# Patient Record
Sex: Female | Born: 2000 | Race: White | Hispanic: No | Marital: Single | Smoking: Never smoker
Health system: Southern US, Community
[De-identification: ages and names within clinical notes are randomized; demographics above are authoritative.]

## PROBLEM LIST (undated history)

## (undated) DIAGNOSIS — Z789 Other specified health status: Secondary | ICD-10-CM

## (undated) HISTORY — DX: Other specified health status: Z78.9

## (undated) HISTORY — PX: WISDOM TOOTH EXTRACTION: SHX21

---
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
## 2020-01-27 IMAGING — DX CHEST - 2 VIEW
2 series · 2 of 2 positions shown · non-contrast
Comparison: None.

CLINICAL DATA: Right posterior chest pain.  No known trauma.

EXAM:
CHEST - 2 VIEW

[chest pa]
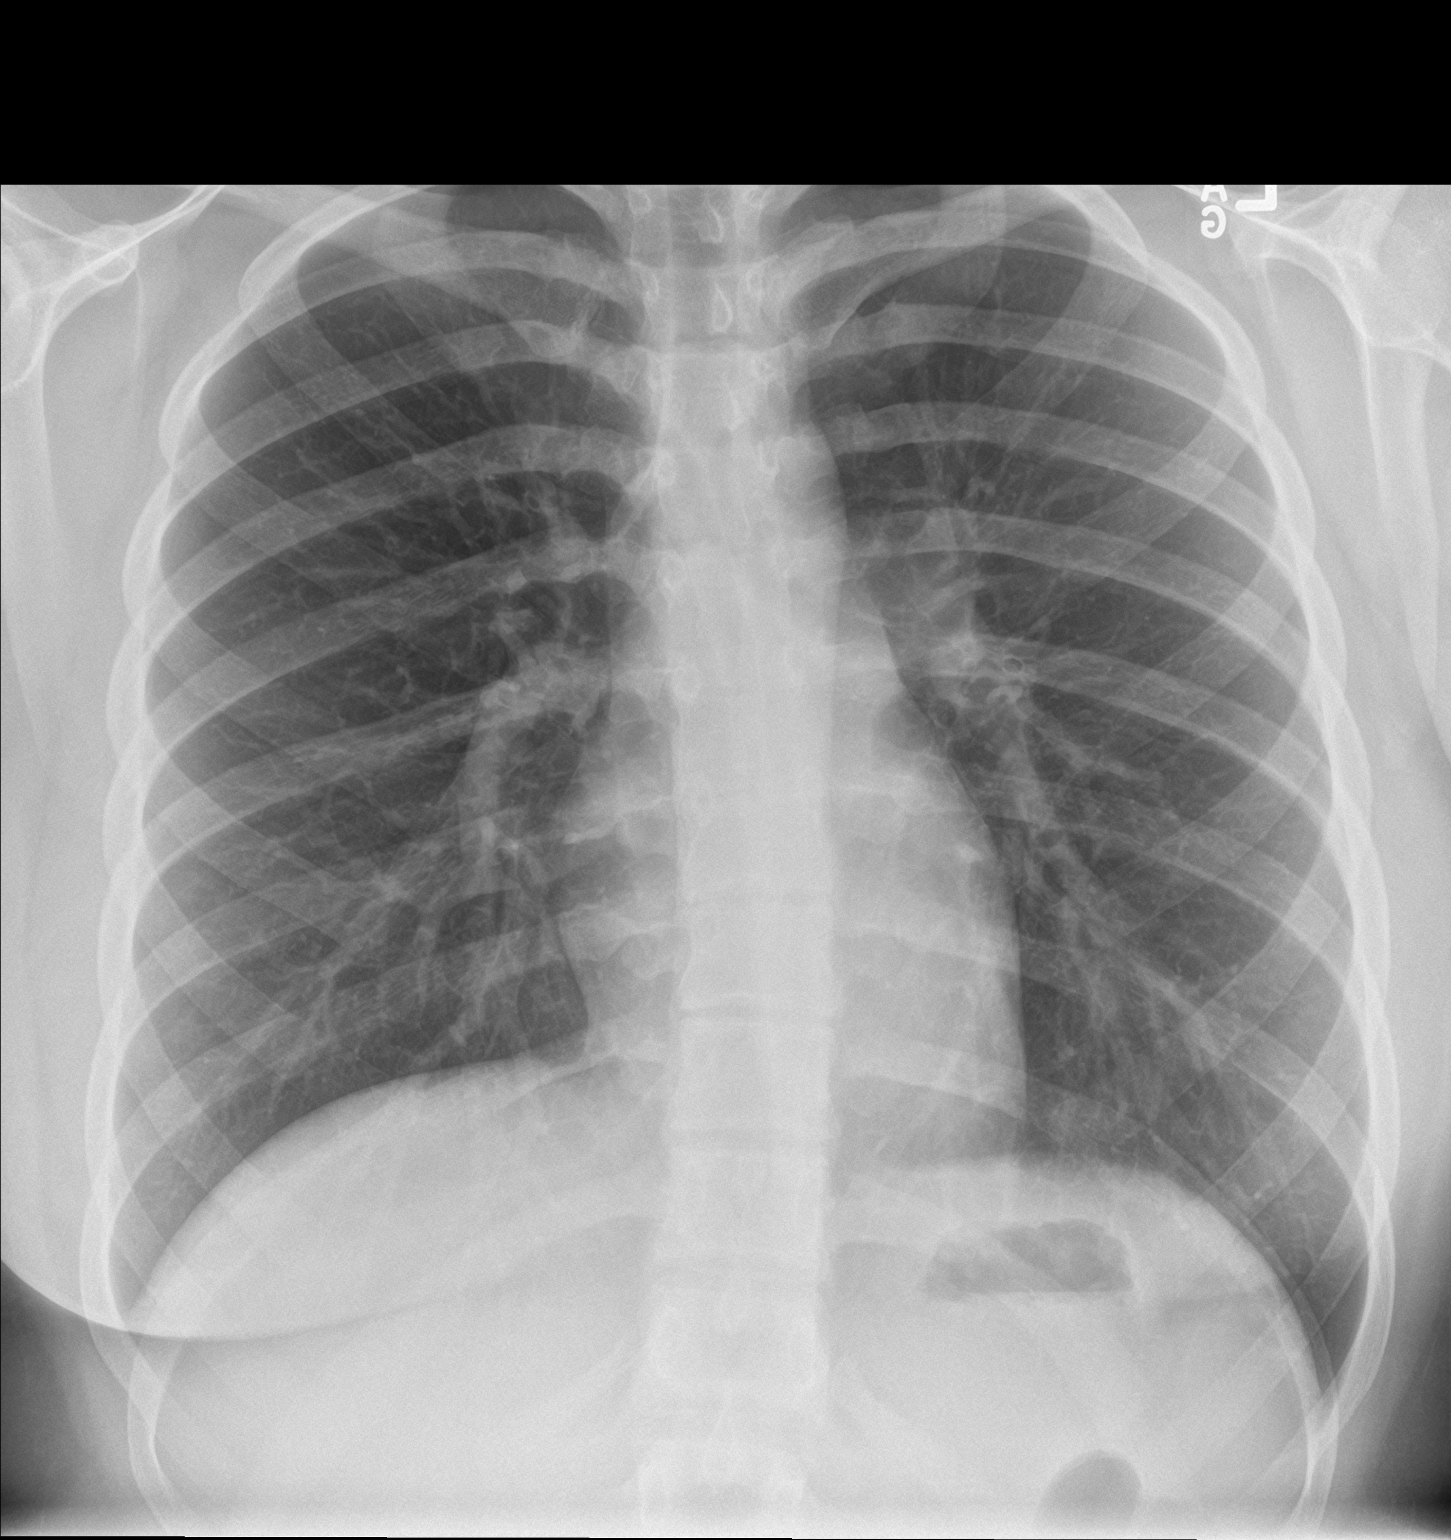

[chest lat]
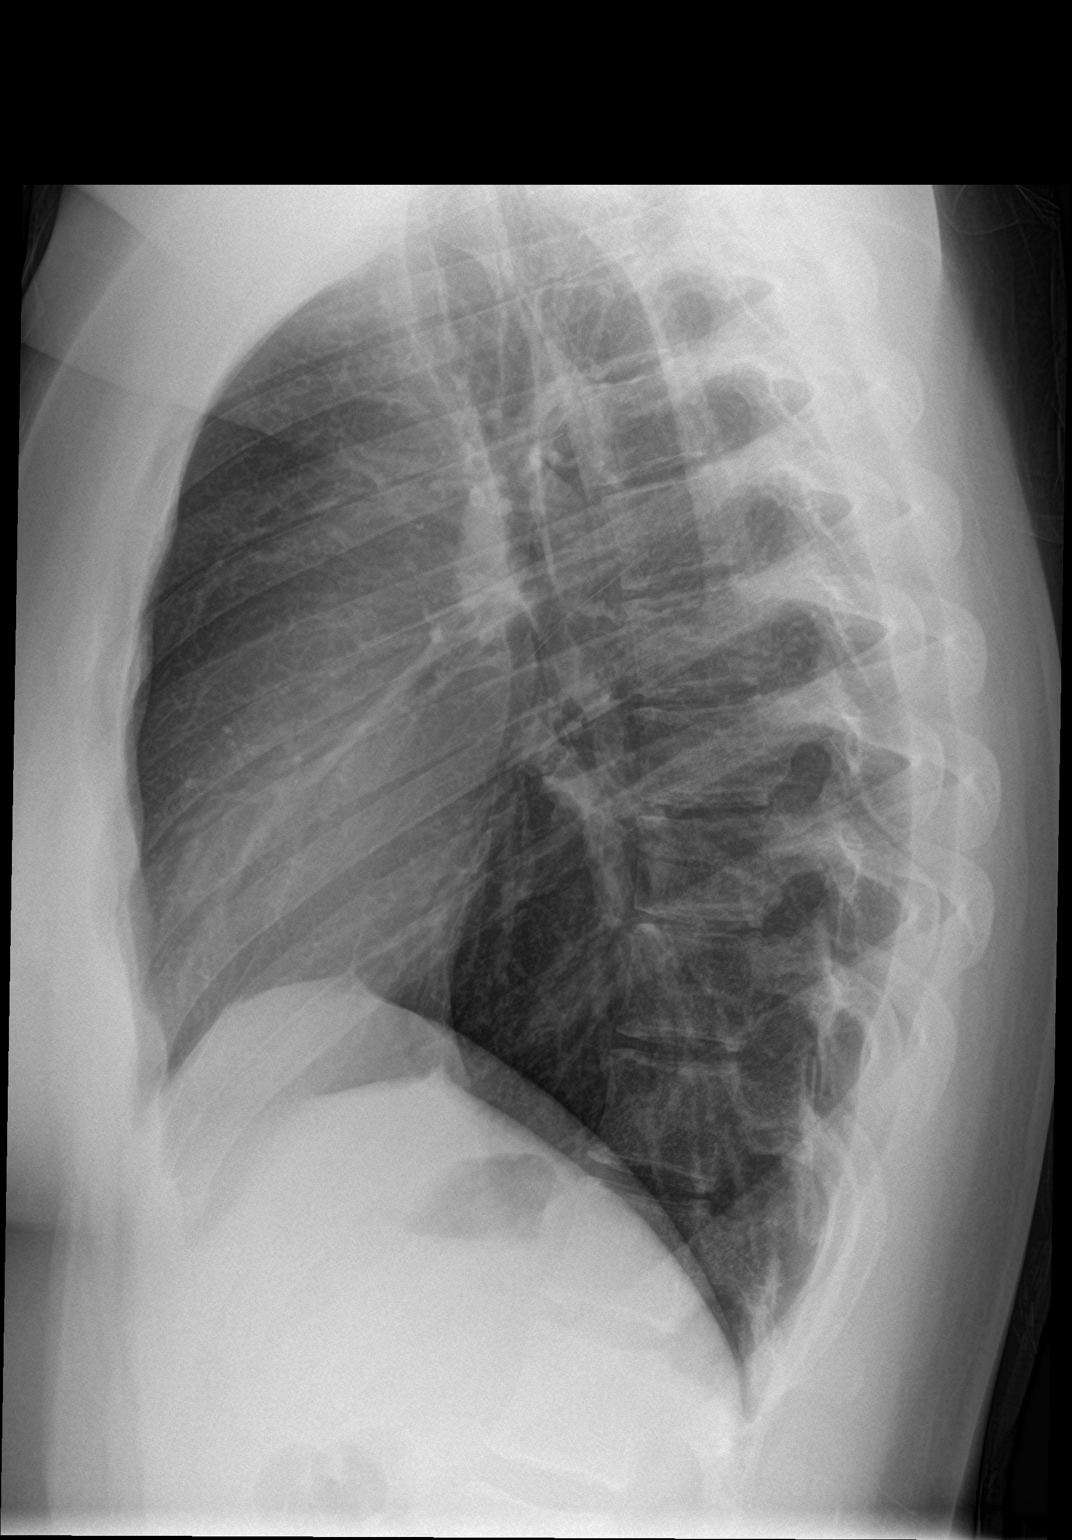

[2 of 2 positions shown; findings below may reference images not displayed]

FINDINGS: Normal cardiac silhouette and mediastinal contours. No focal
parenchymal opacities. No pleural effusion or pneumothorax. No
evidence of edema.

No acute osseous abnormalities with special attention paid to the
right-sided ribs. Mild scoliotic curvature of the thoracolumbar
spine, potentially positional. Regional soft tissues appear normal.
IMPRESSION: No explanation for patient's right posterior chest pain.

## 2021-10-03 ENCOUNTER — Encounter (INDEPENDENT_AMBULATORY_CARE_PROVIDER_SITE_OTHER): Payer: Self-pay | Admitting: *Deleted

## 2021-11-07 ENCOUNTER — Ambulatory Visit (INDEPENDENT_AMBULATORY_CARE_PROVIDER_SITE_OTHER): Payer: Managed Care, Other (non HMO) | Admitting: Gastroenterology

## 2022-07-26 ENCOUNTER — Ambulatory Visit (INDEPENDENT_AMBULATORY_CARE_PROVIDER_SITE_OTHER): Payer: Managed Care, Other (non HMO) | Admitting: Adult Health

## 2022-07-26 ENCOUNTER — Encounter: Payer: Self-pay | Admitting: Adult Health

## 2022-07-26 ENCOUNTER — Other Ambulatory Visit (HOSPITAL_COMMUNITY)
Admission: RE | Admit: 2022-07-26 | Discharge: 2022-07-26 | Disposition: A | Discharge status: Home / Self Care | Payer: Managed Care, Other (non HMO) | Source: Ambulatory Visit | Attending: Adult Health | Admitting: Adult Health

## 2022-07-26 VITALS — BP 119/74 | HR 82 | Ht 66.0 in | Wt 223.0 lb

## 2022-07-26 DIAGNOSIS — O3680X0 Pregnancy with inconclusive fetal viability, not applicable or unspecified: Secondary | ICD-10-CM | POA: Insufficient documentation

## 2022-07-26 DIAGNOSIS — Z01419 Encounter for gynecological examination (general) (routine) without abnormal findings: Secondary | ICD-10-CM | POA: Diagnosis present

## 2022-07-26 DIAGNOSIS — Z3A09 9 weeks gestation of pregnancy: Secondary | ICD-10-CM | POA: Insufficient documentation

## 2022-07-26 DIAGNOSIS — Z3201 Encounter for pregnancy test, result positive: Secondary | ICD-10-CM | POA: Insufficient documentation

## 2022-07-26 LAB — POCT URINE PREGNANCY: Preg Test, Ur: POSITIVE — AB

## 2022-07-26 NOTE — Progress Notes (Signed)
Patient ID: Christina Schultz, female   DOB: Jun 02, 2001, 21 y.o.   MRN: [id number] History of Present Illness: Christina Schultz is a 21 year old white female,single, G1P0, in for well woman gyn exam and first pap. She had +UPT Tuesday and has had nausea for about 2 weeks. LMP about 05/22/22. She works in Location manager at [employer].  PCP is Edison Pace PA.   Current Medications, Allergies, Past Medical History, Past Surgical History, Family History and Social History were reviewed in [employer] record.     Review of Systems: Patient denies any headaches, hearing loss, fatigue, blurred vision, shortness of breath, chest pain, abdominal pain, problems with bowel movements, urination, or intercourse. No joint pain or mood swings.  +missed periods +nausea    Physical Exam:BP 119/74 (BP Location: Left Arm, Patient Position: Sitting, Cuff Size: Normal)   Pulse 82   Ht 5\' 6"  (1.676 m)   Wt 223 lb (101.2 kg)   LMP 05/22/2022   BMI 35.99 kg/m  UPT is +, about 9+2 weeks by LMP with EDD 02/27/23. General:  Well developed, well nourished, no acute distress Skin:  Warm and dry Neck:  Midline trachea, normal thyroid, good ROM, no lymphadenopathy Lungs; Clear to auscultation bilaterally Breast:  No dominant palpable mass, retraction, or nipple discharge,has bilateral nipple rods Cardiovascular: Regular rate and rhythm Abdomen:  Soft, non tender, no hepatosplenomegaly Pelvic:  External genitalia is normal in appearance, no lesions.  The vagina is normal in appearance. Urethra has no lesions or masses. The cervix is nulliparous, pap with CG/CHL performed.  Uterus is felt to be about 8 weeks in size,non tender.  No adnexal masses or tenderness noted.Bladder is non tender, no masses felt. Extremities/musculoskeletal:  No swelling or varicosities noted, no clubbing or cyanosis Psych:  No mood changes, alert and cooperative,seems happy AA is 4,none now Fall risk is low    07/26/2022    2:02 PM   Depression screen PHQ 2/9  Decreased Interest 0  Down, Depressed, Hopeless 0  PHQ - 2 Score 0  Altered sleeping 1  Tired, decreased energy 2  Change in appetite 1  Feeling bad or failure about yourself  0  Trouble concentrating 0  Moving slowly or fidgety/restless 0  Suicidal thoughts 0  PHQ-9 Score 4       07/26/2022    2:02 PM  GAD 7 : Generalized Anxiety Score  Nervous, Anxious, on Edge 0  Control/stop worrying 0  Worry too much - different things 0  Trouble relaxing 0  Restless 0  Easily annoyed or irritable 1  Afraid - awful might happen 0  Total GAD 7 Score 1    Upstream - 07/26/22 1401       Pregnancy Intention Screening   Does the patient want to become pregnant in the next year? N/A    Does the patient's partner want to become pregnant in the next year? N/A    Would the patient like to discuss contraceptive options today? N/A      Contraception Wrap Up   Current Method Pregnant/Seeking Pregnancy    End Method Pregnant/Seeking Pregnancy              Examination chaperoned by 09/25/22 LPN  Impression and Plan: 1. Encounter for gynecological examination with Papanicolaou smear of cervix Pap sent Pap in 3 years if normal Physical in 1 year  - Cytology - PAP( [place])  2. Pregnancy examination or test, positive result - POCT urine  pregnancy  3. [redacted] weeks gestation of pregnancy Take OTC PNV Review OB handout by [facility]   4. Encounter to determine fetal viability of pregnancy, single or unspecified fetus Will get dating [place] in about 3 weeks  - US OB Comp Less 14 Wks; Future

## 2022-08-01 LAB — CYTOLOGY - PAP
Chlamydia: NEGATIVE
Comment: NEGATIVE
Comment: NORMAL
Diagnosis: NEGATIVE
Neisseria Gonorrhea: NEGATIVE

## 2022-08-17 ENCOUNTER — Ambulatory Visit (INDEPENDENT_AMBULATORY_CARE_PROVIDER_SITE_OTHER): Payer: Managed Care, Other (non HMO)

## 2022-08-17 ENCOUNTER — Encounter: Payer: Self-pay | Admitting: Obstetrics & Gynecology

## 2022-08-17 DIAGNOSIS — O3680X0 Pregnancy with inconclusive fetal viability, not applicable or unspecified: Secondary | ICD-10-CM | POA: Diagnosis not present

## 2022-08-17 DIAGNOSIS — Z3A14 14 weeks gestation of pregnancy: Secondary | ICD-10-CM

## 2022-08-17 NOTE — Progress Notes (Signed)
[place] 11+2 wks,single IUP with yolk sac,CRL 45 mm,FHR 167 bpm,normal ovaries

## 2022-08-21 ENCOUNTER — Other Ambulatory Visit: Payer: Managed Care, Other (non HMO)

## 2022-08-29 ENCOUNTER — Ambulatory Visit: Payer: Managed Care, Other (non HMO) | Admitting: *Deleted

## 2022-08-29 ENCOUNTER — Ambulatory Visit: Payer: Managed Care, Other (non HMO) | Admitting: Advanced Practice Midwife

## 2022-08-29 ENCOUNTER — Encounter: Payer: Self-pay | Admitting: Advanced Practice Midwife

## 2022-08-29 VITALS — BP 118/73 | HR 79 | Wt 224.0 lb

## 2022-08-29 DIAGNOSIS — Z3A13 13 weeks gestation of pregnancy: Secondary | ICD-10-CM

## 2022-08-29 DIAGNOSIS — Z363 Encounter for antenatal screening for malformations: Secondary | ICD-10-CM | POA: Diagnosis not present

## 2022-08-29 DIAGNOSIS — Z3401 Encounter for supervision of normal first pregnancy, first trimester: Secondary | ICD-10-CM | POA: Diagnosis not present

## 2022-08-29 DIAGNOSIS — Z6836 Body mass index (BMI) 36.0-36.9, adult: Secondary | ICD-10-CM

## 2022-08-29 DIAGNOSIS — Z3400 Encounter for supervision of normal first pregnancy, unspecified trimester: Secondary | ICD-10-CM | POA: Insufficient documentation

## 2022-08-29 LAB — POCT URINALYSIS DIPSTICK OB
Blood, UA: NEGATIVE
Glucose, UA: NEGATIVE
Ketones, UA: NEGATIVE
Leukocytes, UA: NEGATIVE
Nitrite, UA: NEGATIVE
POC,PROTEIN,UA: NEGATIVE

## 2022-08-29 MED ORDER — ASPIRIN 81 MG PO CHEW: 162.0000 mg | CHEWABLE_TABLET | Freq: Every day | ORAL | 7 refills | Status: DC

## 2022-08-29 NOTE — Progress Notes (Signed)
INITIAL OBSTETRICAL VISIT Patient name: Christina Schultz MRN [id number]  Date of birth: [birth year] Chief Complaint:   Initial Prenatal Visit  History of Present Illness:   Christina Schultz is a 21 y.o. G1P0 Caucasian female at [redacted]w[redacted]d by [place] at 11.2 weeks with an Estimated Date of Delivery: 03/06/23 being seen today for her initial obstetrical visit.   Patient's last menstrual period was 05/09/2022. Her obstetrical history is significant for primigravida.   Today she reports fatigue and nausea.  Last pap Sept 2023. Results were: NILM w/ HRHPV not done     07/26/2022    2:02 PM  Depression screen PHQ 2/9  Decreased Interest 0  Down, Depressed, Hopeless 0  PHQ - 2 Score 0  Altered sleeping 1  Tired, decreased energy 2  Change in appetite 1  Feeling bad or failure about yourself  0  Trouble concentrating 0  Moving slowly or fidgety/restless 0  Suicidal thoughts 0  PHQ-9 Score 4        07/26/2022    2:02 PM  GAD 7 : Generalized Anxiety Score  Nervous, Anxious, on Edge 0  Control/stop worrying 0  Worry too much - different things 0  Trouble relaxing 0  Restless 0  Easily annoyed or irritable 1  Afraid - awful might happen 0  Total GAD 7 Score 1     Review of Systems:   Pertinent items are noted in HPI Denies cramping/contractions, leakage of fluid, vaginal bleeding, abnormal vaginal discharge w/ itching/odor/irritation, headaches, visual changes, shortness of breath, chest pain, abdominal pain, severe nausea/vomiting, or problems with urination or bowel movements unless otherwise stated above.  Pertinent History Reviewed:  Reviewed past medical,surgical, social, obstetrical and family history.  Reviewed problem list, medications and allergies. OB History  Gravida Para Term Preterm AB Living  1            SAB IAB Ectopic Multiple Live Births               # Outcome Date GA Lbr Len/2nd Weight Sex Delivery Anes PTL Lv  1 Current            Physical Assessment:   Vitals:    08/29/22 1008  BP: 118/73  Pulse: 79  Weight: 224 lb (101.6 kg)  Body mass index is 36.15 kg/m.       Physical Examination:  General appearance - well appearing, and in no distress  Mental status - alert, oriented to person, place, and time  Psych:  She has a normal mood and affect  Skin - warm and dry, normal color, no suspicious lesions noted  Chest - effort normal, all lung fields clear to auscultation bilaterally  Heart - normal rate and regular rhythm  Abdomen - soft, nontender; FHTs 160 bpm  Extremities:  No swelling or varicosities noted  Pelvic - not indicated  Thin prep pap is not done   TODAY'S NT : declines  Results for orders placed or performed in visit on 08/29/22 (from the past 24 hour(s))  POC Urinalysis Dipstick OB   Collection Time: 08/29/22 10:56 AM  Result Value Ref Range   Color, UA     Clarity, UA     Glucose, UA Negative Negative   Bilirubin, UA     Ketones, UA neg    Spec Grav, UA     Blood, UA neg    pH, UA     POC,PROTEIN,UA Negative Negative, Trace, Small (1+), Moderate (2+), Large (3+), 4+  Urobilinogen, UA     Nitrite, UA neg    Leukocytes, UA Negative Negative   Appearance     Odor      Assessment & Plan:  1) Low-Risk Pregnancy G1P0 at [redacted]w[redacted]d with an Estimated Date of Delivery: 03/06/23   2) Initial OB visit  3) BMI/nullip, rx bASA 162mg /day  Meds:  Meds ordered this encounter  Medications   aspirin 81 MG chewable tablet    Sig: Chew 2 tablets (162 mg total) by mouth daily.    Dispense:  60 tablet    Refill:  7    Order Specific Question:   Supervising Provider    Answer:   Janyth Pupa [[id number]]    Initial labs obtained Continue prenatal vitamins Reviewed n/v relief measures and warning s/s to report Reviewed recommended weight gain based on pre-gravid BMI Encouraged well-balanced diet Genetic & carrier screening discussed: requests Panorama, AFP, and Horizon , declines NT/IT Ultrasound discussed; fetal survey:  requested CCNC completed> form faxed if has or is planning to apply for medicaid The nature of [place] for [employer] with multiple MDs and other Advanced Practice Providers was explained to patient; also emphasized that fellows, residents, and students are part of our team. Does not have home bp cuff. Office bp cuff given: yes. Rx sent: n/a. Check bp weekly, let us know if consistently >140/90.   Indications for ASA therapy (per uptodate) OR Two or more of the following: Nulliparity Yes Obesity (BMI>30 kg/m2) Yes  Indications for early A1C (per uptodate) First-degree relative with diabetes Yes (pt's mother)  Follow-up: Return for 4wk LROB, then 7wk LROB and anatomy u/s.   Orders Placed This Encounter  Procedures   Urine Culture   GC/Chlamydia Probe Amp   US OB Comp + 14 Wk   CBC/D/Plt+RPR+Rh+ABO+RubIgG...   HORIZON CUSTOM   PANORAMA PRENATAL TEST FULL PANEL   Hemoglobin A1c   POC Urinalysis Dipstick OB    Myrtis Ser [facility] 08/29/2022 11:24 AM

## 2022-08-29 NOTE — Patient Instructions (Signed)
Christina Schultz, thank you for choosing our office today! We appreciate the opportunity to meet your healthcare needs. You may receive a short survey by mail, e-mail, or through [employer]. If you are happy with your care we would appreciate if you could take just a few minutes to complete the survey questions. We read all of your comments and take your feedback very seriously. Thank you again for choosing our office.  [facility] at [place] at [facility] ([place], [place] [address]) Entrance C, located off of [address]   Nausea & Vomiting Have saltine crackers or pretzels by your bed and eat a few bites before you raise your head out of bed in the morning Eat small frequent meals throughout the day instead of large meals Drink plenty of fluids throughout the day to stay hydrated, just don't drink a lot of fluids with your meals.  This can make your stomach fill up faster making you feel sick Do not brush your teeth right after you eat Products with real ginger are good for nausea, like ginger ale and ginger hard candy Make sure it says made with real ginger! Sucking on sour candy like lemon heads is also good for nausea If your prenatal vitamins make you nauseated, take them at night so you will sleep through the nausea Sea Bands If you feel like you need medicine for the nausea & vomiting please let us know If you are unable to keep any fluids or food down please let us know   Constipation Drink plenty of fluid, preferably water, throughout the day Eat foods high in fiber such as fruits, vegetables, and grains Exercise, such as walking, is a good way to keep your bowels regular Drink warm fluids, especially warm prune juice, or decaf coffee Eat a 1/2 cup of real oatmeal (not instant), 1/2 cup applesauce, and 1/2-1 cup warm prune juice every day If needed, you may take Colace (docusate sodium) stool softener  once or twice a day to help keep the stool soft.  If you still are having problems with constipation, you may take Miralax once daily as needed to help keep your bowels regular.   Home Blood Pressure Monitoring for Patients   Your provider has recommended that you check your blood pressure (BP) at least once a week at home. If you do not have a blood pressure cuff at home, one will be provided for you. Contact your provider if you have not received your monitor within 1 week.   Helpful Tips for Accurate Home Blood Pressure Checks  Don't smoke, exercise, or drink caffeine 30 minutes before checking your BP Use the restroom before checking your BP (a full bladder can raise your pressure) Relax in a comfortable upright chair Feet on the ground Left arm resting comfortably on a flat surface at the level of your heart Legs uncrossed Back supported Sit quietly and don't talk Place the cuff on your bare arm Adjust snuggly, so that only two fingertips can fit between your skin and the top of the cuff Check 2 readings separated by at least one minute Keep a log of your BP readings For a visual, please reference this diagram: http://ccnc.care/bpdiagram  Provider Name: [facility]     Phone: [phone]  Zone 1: ALL CLEAR  Continue to monitor your symptoms:  BP reading is less than 140 (top number) or less than 90 (bottom  number)  No right upper stomach pain No headaches or seeing spots No feeling nauseated or throwing up No swelling in face and hands  Zone 2: CAUTION Call your doctor's office for any of the following:  BP reading is greater than 140 (top number) or greater than 90 (bottom number)  Stomach pain under your ribs in the middle or right side Headaches or seeing spots Feeling nauseated or throwing up Swelling in face and hands  Zone 3: EMERGENCY  Seek immediate medical care if you have any of the following:  BP reading is greater than160 (top number) or greater than  110 (bottom number) Severe headaches not improving with Tylenol Serious difficulty catching your breath Any worsening symptoms from Zone 2    First Trimester of Pregnancy The first trimester of pregnancy is from week 1 until the end of week 12 (months 1 through 3). A week after a sperm fertilizes an egg, the egg will implant on the wall of the uterus. This embryo will begin to develop into a baby. Genes from you and your partner are forming the baby. The female genes determine whether the baby is a boy or a girl. At 6-8 weeks, the eyes and face are formed, and the heartbeat can be seen on ultrasound. At the end of 12 weeks, all the baby's organs are formed.  Now that you are pregnant, you will want to do everything you can to have a healthy baby. Two of the most important things are to get good prenatal care and to follow your health care provider's instructions. Prenatal care is all the medical care you receive before the baby's birth. This care will help prevent, find, and treat any problems during the pregnancy and childbirth. BODY CHANGES Your body goes through many changes during pregnancy. The changes vary from woman to woman.  You may gain or lose a couple of pounds at first. You may feel sick to your stomach (nauseous) and throw up (vomit). If the vomiting is uncontrollable, call your health care provider. You may tire easily. You may develop headaches that can be relieved by medicines approved by your health care provider. You may urinate more often. Painful urination may mean you have a bladder infection. You may develop heartburn as a result of your pregnancy. You may develop constipation because certain hormones are causing the muscles that push waste through your intestines to slow down. You may develop hemorrhoids or swollen, bulging veins (varicose veins). Your breasts may begin to grow larger and become tender. Your nipples may stick out more, and the tissue that surrounds them  (areola) may become darker. Your gums may bleed and may be sensitive to brushing and flossing. Dark spots or blotches (chloasma, mask of pregnancy) may develop on your face. This will likely fade after the baby is born. Your menstrual periods will stop. You may have a loss of appetite. You may develop cravings for certain kinds of food. You may have changes in your emotions from day to day, such as being excited to be pregnant or being concerned that something may go wrong with the pregnancy and baby. You may have more vivid and strange dreams. You may have changes in your hair. These can include thickening of your hair, rapid growth, and changes in texture. Some women also have hair loss during or after pregnancy, or hair that feels dry or thin. Your hair will most likely return to normal after your baby is born. WHAT TO EXPECT AT YOUR PRENATAL  VISITS During a routine prenatal visit: You will be weighed to make sure you and the baby are growing normally. Your blood pressure will be taken. Your abdomen will be measured to track your baby's growth. The fetal heartbeat will be listened to starting around week 10 or 12 of your pregnancy. Test results from any previous visits will be discussed. Your health care provider may ask you: How you are feeling. If you are feeling the baby move. If you have had any abnormal symptoms, such as leaking fluid, bleeding, severe headaches, or abdominal cramping. If you have any questions. Other tests that may be performed during your first trimester include: Blood tests to find your blood type and to check for the presence of any previous infections. They will also be used to check for low iron levels (anemia) and Rh antibodies. Later in the pregnancy, blood tests for diabetes will be done along with other tests if problems develop. Urine tests to check for infections, diabetes, or protein in the urine. An ultrasound to confirm the proper growth and development  of the baby. An amniocentesis to check for possible genetic problems. Fetal screens for spina bifida and Down syndrome. You may need other tests to make sure you and the baby are doing well. HOME CARE INSTRUCTIONS  Medicines Follow your health care provider's instructions regarding medicine use. Specific medicines may be either safe or unsafe to take during pregnancy. Take your prenatal vitamins as directed. If you develop constipation, try taking a stool softener if your health care provider approves. Diet Eat regular, well-balanced meals. Choose a variety of foods, such as meat or vegetable-based protein, fish, milk and low-fat dairy products, vegetables, fruits, and whole grain breads and cereals. Your health care provider will help you determine the amount of weight gain that is right for you. Avoid raw meat and uncooked cheese. These carry germs that can cause birth defects in the baby. Eating four or five small meals rather than three large meals a day may help relieve nausea and vomiting. If you start to feel nauseous, eating a few soda crackers can be helpful. Drinking liquids between meals instead of during meals also seems to help nausea and vomiting. If you develop constipation, eat more high-fiber foods, such as fresh vegetables or fruit and whole grains. Drink enough fluids to keep your urine clear or pale yellow. Activity and Exercise Exercise only as directed by your health care provider. Exercising will help you: Control your weight. Stay in shape. Be prepared for labor and delivery. Experiencing pain or cramping in the lower abdomen or low back is a good sign that you should stop exercising. Check with your health care provider before continuing normal exercises. Try to avoid standing for long periods of time. Move your legs often if you must stand in one place for a long time. Avoid heavy lifting. Wear low-heeled shoes, and practice good posture. You may continue to have sex  unless your health care provider directs you otherwise. Relief of Pain or Discomfort Wear a good support bra for breast tenderness.   Take warm sitz baths to soothe any pain or discomfort caused by hemorrhoids. Use hemorrhoid cream if your health care provider approves.   Rest with your legs elevated if you have leg cramps or low back pain. If you develop varicose veins in your legs, wear support hose. Elevate your feet for 15 minutes, 3-4 times a day. Limit salt in your diet. Prenatal Care Schedule your prenatal visits by the  twelfth week of pregnancy. They are usually scheduled monthly at first, then more often in the last 2 months before delivery. Write down your questions. Take them to your prenatal visits. Keep all your prenatal visits as directed by your health care provider. Safety Wear your seat belt at all times when driving. Make a list of emergency phone numbers, including numbers for family, friends, the hospital, and police and fire departments. General Tips Ask your health care provider for a referral to a local prenatal education class. Begin classes no later than at the beginning of month 6 of your pregnancy. Ask for help if you have counseling or nutritional needs during pregnancy. Your health care provider can offer advice or refer you to specialists for help with various needs. Do not use hot tubs, steam rooms, or saunas. Do not douche or use tampons or scented sanitary pads. Do not cross your legs for long periods of time. Avoid cat litter boxes and soil used by cats. These carry germs that can cause birth defects in the baby and possibly loss of the fetus by miscarriage or stillbirth. Avoid all smoking, herbs, alcohol, and medicines not prescribed by your health care provider. Chemicals in these affect the formation and growth of the baby. Schedule a dentist appointment. At home, brush your teeth with a soft toothbrush and be gentle when you floss. SEEK MEDICAL CARE IF:   You have dizziness. You have mild pelvic cramps, pelvic pressure, or nagging pain in the abdominal area. You have persistent nausea, vomiting, or diarrhea. You have a bad smelling vaginal discharge. You have pain with urination. You notice increased swelling in your face, hands, legs, or ankles. SEEK IMMEDIATE MEDICAL CARE IF:  You have a fever. You are leaking fluid from your vagina. You have spotting or bleeding from your vagina. You have severe abdominal cramping or pain. You have rapid weight gain or loss. You vomit blood or material that looks like coffee grounds. You are exposed to [place] measles and have never had them. You are exposed to fifth disease or chickenpox. You develop a severe headache. You have shortness of breath. You have any kind of trauma, such as from a fall or a car accident. Document Released: 10/30/2001 Document Revised: 03/22/2014 Document Reviewed: 09/15/2013 [facility] Patient Information 2015 [place], [place]. This information is not intended to replace advice given to you by your health care provider. Make sure you discuss any questions you have with your health care provider.

## 2022-08-30 LAB — CBC/D/PLT+RPR+RH+ABO+RUBIGG...
Antibody Screen: NEGATIVE
Basophils Absolute: 0 10*3/uL (ref 0.0–0.2)
Basos: 0 %
EOS (ABSOLUTE): 0.1 10*3/uL (ref 0.0–0.4)
Eos: 1 %
HCV Ab: NONREACTIVE
HIV Screen 4th Generation wRfx: NONREACTIVE
Hematocrit: 39.8 % (ref 34.0–46.6)
Hemoglobin: 13.2 g/dL (ref 11.1–15.9)
Hepatitis B Surface Ag: NEGATIVE
Immature Grans (Abs): 0 10*3/uL (ref 0.0–0.1)
Immature Granulocytes: 0 %
Lymphocytes Absolute: 2.1 10*3/uL (ref 0.7–3.1)
Lymphs: 22 %
MCH: 29.3 pg (ref 26.6–33.0)
MCHC: 33.2 g/dL (ref 31.5–35.7)
MCV: 88 fL (ref 79–97)
Monocytes Absolute: 0.5 10*3/uL (ref 0.1–0.9)
Monocytes: 6 %
Neutrophils Absolute: 6.8 10*3/uL (ref 1.4–7.0)
Neutrophils: 71 %
Platelets: 279 10*3/uL (ref 150–450)
RBC: 4.5 x10E6/uL (ref 3.77–5.28)
RDW: 13 % (ref 11.7–15.4)
RPR Ser Ql: NONREACTIVE
Rh Factor: POSITIVE
Rubella Antibodies, IGG: 5.82 index (ref >0.99)
WBC: 9.5 10*3/uL (ref 3.4–10.8)

## 2022-08-30 LAB — HCV INTERPRETATION

## 2022-08-30 LAB — HEMOGLOBIN A1C
Est. average glucose Bld gHb Est-mCnc: 103 mg/dL
Hgb A1c MFr Bld: 5.2 % (ref 4.8–5.6)

## 2022-08-31 LAB — GC/CHLAMYDIA PROBE AMP
Chlamydia trachomatis, NAA: NEGATIVE
Neisseria Gonorrhoeae by PCR: NEGATIVE

## 2022-08-31 LAB — URINE CULTURE

## 2022-09-04 LAB — PANORAMA PRENATAL TEST FULL PANEL:PANORAMA TEST PLUS 5 ADDITIONAL MICRODELETIONS: FETAL FRACTION: 3.9

## 2022-09-09 LAB — HORIZON CUSTOM: REPORT SUMMARY: NEGATIVE

## 2022-09-26 ENCOUNTER — Ambulatory Visit (INDEPENDENT_AMBULATORY_CARE_PROVIDER_SITE_OTHER): Payer: Managed Care, Other (non HMO) | Admitting: Advanced Practice Midwife

## 2022-09-26 VITALS — BP 135/81 | HR 102 | Wt 224.0 lb

## 2022-09-26 DIAGNOSIS — Z3A17 17 weeks gestation of pregnancy: Secondary | ICD-10-CM

## 2022-09-26 DIAGNOSIS — Z1379 Encounter for other screening for genetic and chromosomal anomalies: Secondary | ICD-10-CM

## 2022-09-26 DIAGNOSIS — Z3402 Encounter for supervision of normal first pregnancy, second trimester: Secondary | ICD-10-CM

## 2022-09-26 NOTE — Patient Instructions (Signed)
Christina Schultz, thank you for choosing our office today! We appreciate the opportunity to meet your healthcare needs. You may receive a short survey by mail, e-mail, or through MyChart. If you are happy with your care we would appreciate if you could take just a few minutes to complete the survey questions. We read all of your comments and take your feedback very seriously. Thank you again for choosing our office.  [facility] Team at [facility] ([address] [place], [place] [address]) Entrance C, located off of [address] Free 24/7 valet parking  Go to [email or web address] to register for FREE online childbirth classes  Call the office ([phone]) or go to [facility] if: You begin to severe cramping Your water breaks.  Sometimes it is a big gush of fluid, sometimes it is just a trickle that keeps getting your panties wet or running down your legs You have vaginal bleeding.  It is normal to have a small amount of spotting if your cervix was checked.   [place] [facility]/Family Doctors [facility] ([facility]): [address], [phone]           [facility]: [address], [phone]                [facility] ([facility]): [address], [phone] (call to ask if accepting patients) [facility]: [address], [place], [phone]    [place] [facility]/Family Doctors [facility] ([facility]): [address], [phone] [facility]: [address], [phone] [facility]: [address], [phone]  [place] [facility]  [facility] ([facility]): [phone] [facility]: [address], [phone]   [place] [facility]: [address], [phone]  [place] [facility]  [facility]: [address], [phone]  Home Blood Pressure Monitoring for Patients   Your provider has recommended that you check your blood pressure (BP) at least once a week at home. If you do not have a blood pressure cuff at home, one will be provided for you. Contact your provider if you have not received your monitor within 1 week.   Helpful Tips for Accurate Home Blood Pressure Checks  Don't smoke, exercise, or drink caffeine 30 minutes before checking your BP Use the restroom before checking your BP (a full bladder can raise your pressure) Relax in a comfortable upright chair Feet on the ground Left arm resting comfortably on a flat surface at the level of your heart Legs uncrossed Back supported Sit quietly and don't talk Place the cuff on your bare arm Adjust snuggly, so that only two fingertips can fit between your skin and the top of the cuff Check 2 readings separated by at least one minute Keep a log of your BP readings For a visual, please reference this diagram: http://[facility].care/bpdiagram  Provider Name: [facility]     Phone: [phone]  Zone 1: ALL CLEAR  Continue to monitor your symptoms:  BP reading is less than 140 (top number) or less than 90 (bottom number)  No right upper stomach pain No headaches or seeing spots No feeling nauseated or throwing up No swelling in face and hands  Zone 2: CAUTION Call your doctor's office for any of the following:  BP reading is greater than 140 (top number) or greater than   90 (bottom number)  Stomach pain under your ribs in the middle or right side Headaches or seeing spots Feeling nauseated or throwing up Swelling in face and hands  Zone 3: EMERGENCY  Seek immediate medical care if you have any of the following:  BP reading is greater than160 (top number) or greater than 110 (bottom number) Severe headaches not improving with Tylenol Serious difficulty catching your breath Any worsening symptoms from  Zone 2     Second Trimester of Pregnancy The second trimester is from week 14 through week 27 (months 4 through 6). The second trimester is often a time when you feel your best. Your body has adjusted to being pregnant, and you begin to feel better physically. Usually, morning sickness has lessened or quit completely, you may have more energy, and you may have an increase in appetite. The second trimester is also a time when the fetus is growing rapidly. At the end of the sixth month, the fetus is about 9 inches long and weighs about 1 pounds. You will likely begin to feel the baby move (quickening) between 16 and 20 weeks of pregnancy. Body changes during your second trimester Your body continues to go through many changes during your second trimester. The changes vary from woman to woman. Your weight will continue to increase. You will notice your lower abdomen bulging out. You may begin to get stretch marks on your hips, abdomen, and breasts. You may develop headaches that can be relieved by medicines. The medicines should be approved by your health care provider. You may urinate more often because the fetus is pressing on your bladder. You may develop or continue to have heartburn as a result of your pregnancy. You may develop constipation because certain hormones are causing the muscles that push waste through your intestines to slow down. You may develop hemorrhoids or swollen, bulging veins (varicose veins). You may have back pain. This is caused by: Weight gain. Pregnancy hormones that are relaxing the joints in your pelvis. A shift in weight and the muscles that support your balance. Your breasts will continue to grow and they will continue to become tender. Your gums may bleed and may be sensitive to brushing and flossing. Dark spots or blotches (chloasma, mask of pregnancy) may develop on your face. This will likely fade after the baby is born. A dark line from your belly button to  the pubic area (linea nigra) may appear. This will likely fade after the baby is born. You may have changes in your hair. These can include thickening of your hair, rapid growth, and changes in texture. Some women also have hair loss during or after pregnancy, or hair that feels dry or thin. Your hair will most likely return to normal after your baby is born.  What to expect at prenatal visits During a routine prenatal visit: You will be weighed to make sure you and the fetus are growing normally. Your blood pressure will be taken. Your abdomen will be measured to track your baby's growth. The fetal heartbeat will be listened to. Any test results from the previous visit will be discussed.  Your health care provider may ask you: How you are feeling. If you are feeling the baby move. If you have had any abnormal symptoms, such as leaking fluid, bleeding, severe headaches, or abdominal cramping. If you are using any tobacco products, including cigarettes, chewing tobacco, and electronic cigarettes. If you have any questions.  Other tests that may be performed during   your second trimester include: Blood tests that check for: Low iron levels (anemia). High blood sugar that affects pregnant women (gestational diabetes) between 24 and 28 weeks. Rh antibodies. This is to check for a protein on red blood cells (Rh factor). Urine tests to check for infections, diabetes, or protein in the urine. An ultrasound to confirm the proper growth and development of the baby. An amniocentesis to check for possible genetic problems. Fetal screens for spina bifida and Down syndrome. HIV (human immunodeficiency virus) testing. Routine prenatal testing includes screening for HIV, unless you choose not to have this test.  Follow these instructions at home: Medicines Follow your health care provider's instructions regarding medicine use. Specific medicines may be either safe or unsafe to take during  pregnancy. Take a prenatal vitamin that contains at least 600 micrograms (mcg) of folic acid. If you develop constipation, try taking a stool softener if your health care provider approves. Eating and drinking Eat a balanced diet that includes fresh fruits and vegetables, whole grains, good sources of protein such as meat, eggs, or tofu, and low-fat dairy. Your health care provider will help you determine the amount of weight gain that is right for you. Avoid raw meat and uncooked cheese. These carry germs that can cause birth defects in the baby. If you have low calcium intake from food, talk to your health care provider about whether you should take a daily calcium supplement. Limit foods that are high in fat and processed sugars, such as fried and sweet foods. To prevent constipation: Drink enough fluid to keep your urine clear or pale yellow. Eat foods that are high in fiber, such as fresh fruits and vegetables, whole grains, and beans. Activity Exercise only as directed by your health care provider. Most women can continue their usual exercise routine during pregnancy. Try to exercise for 30 minutes at least 5 days a week. Stop exercising if you experience uterine contractions. Avoid heavy lifting, wear low heel shoes, and practice good posture. A sexual relationship may be continued unless your health care provider directs you otherwise. Relieving pain and discomfort Wear a good support bra to prevent discomfort from breast tenderness. Take warm sitz baths to soothe any pain or discomfort caused by hemorrhoids. Use hemorrhoid cream if your health care provider approves. Rest with your legs elevated if you have leg cramps or low back pain. If you develop varicose veins, wear support hose. Elevate your feet for 15 minutes, 3-4 times a day. Limit salt in your diet. Prenatal Care Write down your questions. Take them to your prenatal visits. Keep all your prenatal visits as told by your health  care provider. This is important. Safety Wear your seat belt at all times when driving. Make a list of emergency phone numbers, including numbers for family, friends, the hospital, and police and fire departments. General instructions Ask your health care provider for a referral to a local prenatal education class. Begin classes no later than the beginning of month 6 of your pregnancy. Ask for help if you have counseling or nutritional needs during pregnancy. Your health care provider can offer advice or refer you to specialists for help with various needs. Do not use hot tubs, steam rooms, or saunas. Do not douche or use tampons or scented sanitary pads. Do not cross your legs for long periods of time. Avoid cat litter boxes and soil used by cats. These carry germs that can cause birth defects in the baby and possibly loss of the   fetus by miscarriage or stillbirth. Avoid all smoking, herbs, alcohol, and unprescribed drugs. Chemicals in these products can affect the formation and growth of the baby. Do not use any products that contain nicotine or tobacco, such as cigarettes and e-cigarettes. If you need help quitting, ask your health care provider. Visit your dentist if you have not gone yet during your pregnancy. Use a soft toothbrush to brush your teeth and be gentle when you floss. Contact a health care provider if: You have dizziness. You have mild pelvic cramps, pelvic pressure, or nagging pain in the abdominal area. You have persistent nausea, vomiting, or diarrhea. You have a bad smelling vaginal discharge. You have pain when you urinate. Get help right away if: You have a fever. You are leaking fluid from your vagina. You have spotting or bleeding from your vagina. You have severe abdominal cramping or pain. You have rapid weight gain or weight loss. You have shortness of breath with chest pain. You notice sudden or extreme swelling of your face, hands, ankles, feet, or legs. You  have not felt your baby move in over an hour. You have severe headaches that do not go away when you take medicine. You have vision changes. Summary The second trimester is from week 14 through week 27 (months 4 through 6). It is also a time when the fetus is growing rapidly. Your body goes through many changes during pregnancy. The changes vary from woman to woman. Avoid all smoking, herbs, alcohol, and unprescribed drugs. These chemicals affect the formation and growth your baby. Do not use any tobacco products, such as cigarettes, chewing tobacco, and e-cigarettes. If you need help quitting, ask your health care provider. Contact your health care provider if you have any questions. Keep all prenatal visits as told by your health care provider. This is important. This information is not intended to replace advice given to you by your health care provider. Make sure you discuss any questions you have with your health care provider. Document Released: 10/30/2001 Document Revised: 04/12/2016 Document Reviewed: 01/06/2013 Elsevier Interactive Patient Education  2017 [employer]  

## 2022-09-26 NOTE — Progress Notes (Signed)
   LOW-RISK PREGNANCY VISIT Patient name: Christina Schultz MRN [id number]  Date of birth: 07-Nov-2001 Chief Complaint:   Routine Prenatal Visit  History of Present Illness:   Christina Schultz is a 21 y.o. G1P0 female at [redacted]w[redacted]d with an Estimated Date of Delivery: 03/06/23 being seen today for ongoing management of a low-risk pregnancy.  Today she reports  RLP . Contractions: Not present. Vag. Bleeding: None.  Movement: Present. denies leaking of fluid. Review of Systems:   Pertinent items are noted in HPI Denies abnormal vaginal discharge w/ itching/odor/irritation, headaches, visual changes, shortness of breath, chest pain, abdominal pain, severe nausea/vomiting, or problems with urination or bowel movements unless otherwise stated above. Pertinent History Reviewed:  Reviewed past medical,surgical, social, obstetrical and family history.  Reviewed problem list, medications and allergies. Physical Assessment:   Vitals:   09/26/22 1356  BP: 135/81  Pulse: (!) 102  Weight: 224 lb (101.6 kg)  Body mass index is 36.15 kg/m.        Physical Examination:   General appearance: Well appearing, and in no distress  Mental status: Alert, oriented to person, place, and time  Skin: Warm & dry  Cardiovascular: Normal heart rate noted  Respiratory: Normal respiratory effort, no distress  Abdomen: Soft, gravid, nontender  Pelvic: Cervical exam deferred         Extremities: Edema: None  Fetal Status: Fetal Heart Rate (bpm): 152   Movement: Present    No results found for this or any previous visit (from the past 24 hour(s)).  Assessment & Plan:  1) Low-risk pregnancy G1P0 at [redacted]w[redacted]d with an Estimated Date of Delivery: 03/06/23   2) RLP, stretches are helping; recommended maternity support belt if it increases   Meds: No orders of the defined types were placed in this encounter.  Labs/procedures today: AFP  Plan:  Continue routine obstetrical care   Reviewed: Preterm labor symptoms and general  obstetric precautions including but not limited to vaginal bleeding, contractions, leaking of fluid and fetal movement were reviewed in detail with the patient.  All questions were answered. Has home bp cuff. Check bp weekly, let us know if >140/90.   Follow-up: Return for As scheduled.(Anatomy u/s)  Orders Placed This Encounter  Procedures   AFP, Serum, Open Spina Bifida   Arabella Merles [facility] 09/26/2022 2:11 PM

## 2022-09-28 LAB — AFP, SERUM, OPEN SPINA BIFIDA
AFP MoM: 0.65
AFP Value: 19.7 ng/mL
Gest. Age on Collection Date: 17 weeks
Maternal Age At EDD: 22 yr
OSBR Risk 1 IN: 10000
Test Results:: NEGATIVE
Weight: 224 [lb_av]

## 2022-10-17 ENCOUNTER — Ambulatory Visit: Payer: Managed Care, Other (non HMO) | Admitting: Advanced Practice Midwife

## 2022-10-17 ENCOUNTER — Ambulatory Visit: Payer: Managed Care, Other (non HMO)

## 2022-10-17 VITALS — BP 134/77 | HR 83 | Wt 227.0 lb

## 2022-10-17 DIAGNOSIS — Z6836 Body mass index (BMI) 36.0-36.9, adult: Secondary | ICD-10-CM

## 2022-10-17 DIAGNOSIS — O99212 Obesity complicating pregnancy, second trimester: Secondary | ICD-10-CM

## 2022-10-17 DIAGNOSIS — Z3A20 20 weeks gestation of pregnancy: Secondary | ICD-10-CM

## 2022-10-17 DIAGNOSIS — E669 Obesity, unspecified: Secondary | ICD-10-CM

## 2022-10-17 DIAGNOSIS — Z3402 Encounter for supervision of normal first pregnancy, second trimester: Secondary | ICD-10-CM

## 2022-10-17 DIAGNOSIS — Z363 Encounter for antenatal screening for malformations: Secondary | ICD-10-CM

## 2022-10-17 NOTE — Progress Notes (Signed)
   LOW-RISK PREGNANCY VISIT Patient name: Christina Schultz MRN [id number]  Date of birth: [birth year] Chief Complaint:   Routine Prenatal Visit  History of Present Illness:   Christina Schultz is a 21 y.o. G1P0 female at [redacted]w[redacted]d with an Estimated Date of Delivery: 03/06/23 being seen today for ongoing management of a low-risk pregnancy.  Today she reports  some RLP discomfort but is overall well . Contractions: Not present. Vag. Bleeding: None.  Movement: Present. denies leaking of fluid. Review of Systems:   Pertinent items are noted in HPI Denies abnormal vaginal discharge w/ itching/odor/irritation, headaches, visual changes, shortness of breath, chest pain, abdominal pain, severe nausea/vomiting, or problems with urination or bowel movements unless otherwise stated above. Pertinent History Reviewed:  Reviewed past medical,surgical, social, obstetrical and family history.  Reviewed problem list, medications and allergies. Physical Assessment:   Vitals:   10/17/22 0925  BP: 134/77  Pulse: 83  Weight: 227 lb (103 kg)  Body mass index is 36.64 kg/m.        Physical Examination:   General appearance: Well appearing, and in no distress  Mental status: Alert, oriented to person, place, and time  Skin: Warm & dry  Cardiovascular: Normal heart rate noted  Respiratory: Normal respiratory effort, no distress  Abdomen: Soft, gravid, nontender  Pelvic: Cervical exam deferred         Extremities:    Fetal Status: Fetal Heart Rate (bpm): 154 u/s   Movement: Present    Anatomy u/s: [place] 20 wks,breech,fundal placenta gr 0,normal ovaries,LVEICF,RVEICF,CX 3.8 cm,SVP of fluid 4.2 cm,fhr 154 BPM,EFW 301 25%,anatomy complete   No results found for this or any previous visit (from the past 24 hour(s)).  Assessment & Plan:  1) Low-risk pregnancy G1P0 at [redacted]w[redacted]d with an Estimated Date of Delivery: 03/06/23     Meds: No orders of the defined types were placed in this encounter.  Labs/procedures today: anatomy  u/s  Plan:  Continue routine obstetrical care   Reviewed: Preterm labor symptoms and general obstetric precautions including but not limited to vaginal bleeding, contractions, leaking of fluid and fetal movement were reviewed in detail with the patient.  All questions were answered. Has home bp cuff. Check bp weekly, let us know if >140/90.   Follow-up: Return in about 4 weeks (around 11/14/2022) for LROB, in person.  No orders of the defined types were placed in this encounter.  Arabella Merles CNM 10/17/2022 9:43 AM

## 2022-10-17 NOTE — Patient Instructions (Signed)
Christina Schultz, thank you for choosing our office today! We appreciate the opportunity to meet your healthcare needs. You may receive a short survey by mail, e-mail, or through [employer]. If you are happy with your care we would appreciate if you could take just a few minutes to complete the survey questions. We read all of your comments and take your feedback very seriously. Thank you again for choosing our office.  Center for [employer] Team at [facility] ([address] [place], [place] [address]) Entrance C, located off of [address] Free 24/7 valet parking  Go to [email or web address] to register for FREE online childbirth classes  Call the office [phone]) or go to [facility] if: You begin to severe cramping Your water breaks.  Sometimes it is a big gush of fluid, sometimes it is just a trickle that keeps getting your panties wet or running down your legs You have vaginal bleeding.  It is normal to have a small amount of spotting if your cervix was checked.   [facility] Pediatricians/Family Doctors [facility]): [address] Colette Ribas, [phone]           [facility]: [address], [phone]                [facility]): [address], [phone] (call to ask if accepting patients) [facility] Department: [address], [place], [phone]    [facility] Pediatricians/Family Doctors [facility]): [phone] [address], [phone] [facility]: [address] [place], [phone] [facility]: [address], [phone]  [facility] Doctors  [facility]): [phone] [facility]: [address] [phone]   [facility] Doctors [facility]: [address] [phone]  [facility] Doctors  [facility]: [phone], [phone]  Home Blood Pressure Monitoring for Patients   Your provider has recommended that you check your blood pressure (BP) at least once a week at home. If you do not have a blood pressure cuff at home, one will be provided for you. Contact your provider if you have not received your monitor within 1 week.   Helpful Tips for Accurate Home Blood Pressure Checks  Don't smoke, exercise, or drink caffeine 30 minutes before checking your BP Use the restroom before checking your BP (a full bladder can raise your pressure) Relax in a comfortable upright chair Feet on the ground Left arm resting comfortably on a flat surface at the level of your heart Legs uncrossed Back supported Sit quietly and don't talk Place the cuff on your bare arm Adjust snuggly, so that only two fingertips can fit between your skin and the top of the cuff Check 2 readings separated by at least one minute Keep a log of your BP readings For a visual, please reference this diagram: http://ccnc.care/bpdiagram  Provider Name: [facility]     Phone: [phone]  Zone 1: ALL CLEAR  Continue to monitor your symptoms:  BP reading is less than 140 (top number) or less than 90 (bottom number)  No right upper stomach pain No headaches or seeing spots No feeling nauseated or throwing up No swelling in face and hands  Zone 2: CAUTION Call your doctor's office for any of the following:  BP reading is greater than 140 (top number) or greater than  90 (bottom number)  Stomach pain under your ribs in the middle or right side Headaches or seeing spots Feeling nauseated or throwing up Swelling in face and hands  Zone 3: EMERGENCY  Seek immediate medical care if you have any of the following:  BP reading is greater than160 (top number) or greater than 110 (bottom number) Severe headaches not improving with Tylenol Serious difficulty catching your breath Any worsening symptoms from  Zone 2     Second Trimester of Pregnancy The second trimester is from week 14 through week 27 (months 4 through 6). The second trimester is often a time when you feel your best. Your body has adjusted to being pregnant, and you begin to feel better physically. Usually, morning sickness has lessened or quit completely, you may have more energy, and you may have an increase in appetite. The second trimester is also a time when the fetus is growing rapidly. At the end of the sixth month, the fetus is about 9 inches long and weighs about 1 pounds. You will likely begin to feel the baby move (quickening) between 16 and 20 weeks of pregnancy. Body changes during your second trimester Your body continues to go through many changes during your second trimester. The changes vary from woman to woman. Your weight will continue to increase. You will notice your lower abdomen bulging out. You may begin to get stretch marks on your hips, abdomen, and breasts. You may develop headaches that can be relieved by medicines. The medicines should be approved by your health care provider. You may urinate more often because the fetus is pressing on your bladder. You may develop or continue to have heartburn as a result of your pregnancy. You may develop constipation because certain hormones are causing the muscles that push waste through your intestines to slow down. You may develop hemorrhoids or swollen, bulging veins (varicose veins). You may have back pain. This is caused by: Weight gain. Pregnancy hormones that are relaxing the joints in your pelvis. A shift in weight and the muscles that support your balance. Your breasts will continue to grow and they will continue to become tender. Your gums may bleed and may be sensitive to brushing and flossing. Dark spots or blotches (chloasma, mask of pregnancy) may develop on your face. This will likely fade after the baby is born. A dark line from your belly button to  the pubic area (linea nigra) may appear. This will likely fade after the baby is born. You may have changes in your hair. These can include thickening of your hair, rapid growth, and changes in texture. Some women also have hair loss during or after pregnancy, or hair that feels dry or thin. Your hair will most likely return to normal after your baby is born.  What to expect at prenatal visits During a routine prenatal visit: You will be weighed to make sure you and the fetus are growing normally. Your blood pressure will be taken. Your abdomen will be measured to track your baby's growth. The fetal heartbeat will be listened to. Any test results from the previous visit will be discussed.  Your health care provider may ask you: How you are feeling. If you are feeling the baby move. If you have had any abnormal symptoms, such as leaking fluid, bleeding, severe headaches, or abdominal cramping. If you are using any tobacco products, including cigarettes, chewing tobacco, and electronic cigarettes. If you have any questions.  Other tests that may be performed during   your second trimester include: Blood tests that check for: Low iron levels (anemia). High blood sugar that affects pregnant women (gestational diabetes) between 24 and 28 weeks. Rh antibodies. This is to check for a protein on red blood cells (Rh factor). Urine tests to check for infections, diabetes, or protein in the urine. An ultrasound to confirm the proper growth and development of the baby. An amniocentesis to check for possible genetic problems. Fetal screens for spina bifida and Down syndrome. HIV (human immunodeficiency virus) testing. Routine prenatal testing includes screening for HIV, unless you choose not to have this test.  Follow these instructions at home: Medicines Follow your health care provider's instructions regarding medicine use. Specific medicines may be either safe or unsafe to take during  pregnancy. Take a prenatal vitamin that contains at least 600 micrograms (mcg) of folic acid. If you develop constipation, try taking a stool softener if your health care provider approves. Eating and drinking Eat a balanced diet that includes fresh fruits and vegetables, whole grains, good sources of protein such as meat, eggs, or tofu, and low-fat dairy. Your health care provider will help you determine the amount of weight gain that is right for you. Avoid raw meat and uncooked cheese. These carry germs that can cause birth defects in the baby. If you have low calcium intake from food, talk to your health care provider about whether you should take a daily calcium supplement. Limit foods that are high in fat and processed sugars, such as fried and sweet foods. To prevent constipation: Drink enough fluid to keep your urine clear or pale yellow. Eat foods that are high in fiber, such as fresh fruits and vegetables, whole grains, and beans. Activity Exercise only as directed by your health care provider. Most women can continue their usual exercise routine during pregnancy. Try to exercise for 30 minutes at least 5 days a week. Stop exercising if you experience uterine contractions. Avoid heavy lifting, wear low heel shoes, and practice good posture. A sexual relationship may be continued unless your health care provider directs you otherwise. Relieving pain and discomfort Wear a good support bra to prevent discomfort from breast tenderness. Take warm sitz baths to soothe any pain or discomfort caused by hemorrhoids. Use hemorrhoid cream if your health care provider approves. Rest with your legs elevated if you have leg cramps or low back pain. If you develop varicose veins, wear support hose. Elevate your feet for 15 minutes, 3-4 times a day. Limit salt in your diet. Prenatal Care Write down your questions. Take them to your prenatal visits. Keep all your prenatal visits as told by your health  care provider. This is important. Safety Wear your seat belt at all times when driving. Make a list of emergency phone numbers, including numbers for family, friends, the hospital, and police and fire departments. General instructions Ask your health care provider for a referral to a local prenatal education class. Begin classes no later than the beginning of month 6 of your pregnancy. Ask for help if you have counseling or nutritional needs during pregnancy. Your health care provider can offer advice or refer you to specialists for help with various needs. Do not use hot tubs, steam rooms, or saunas. Do not douche or use tampons or scented sanitary pads. Do not cross your legs for long periods of time. Avoid cat litter boxes and soil used by cats. These carry germs that can cause birth defects in the baby and possibly loss of the   fetus by miscarriage or stillbirth. Avoid all smoking, herbs, alcohol, and unprescribed drugs. Chemicals in these products can affect the formation and growth of the baby. Do not use any products that contain nicotine or tobacco, such as cigarettes and e-cigarettes. If you need help quitting, ask your health care provider. Visit your dentist if you have not gone yet during your pregnancy. Use a soft toothbrush to brush your teeth and be gentle when you floss. Contact a health care provider if: You have dizziness. You have mild pelvic cramps, pelvic pressure, or nagging pain in the abdominal area. You have persistent nausea, vomiting, or diarrhea. You have a bad smelling vaginal discharge. You have pain when you urinate. Get help right away if: You have a fever. You are leaking fluid from your vagina. You have spotting or bleeding from your vagina. You have severe abdominal cramping or pain. You have rapid weight gain or weight loss. You have shortness of breath with chest pain. You notice sudden or extreme swelling of your face, hands, ankles, feet, or legs. You  have not felt your baby move in over an hour. You have severe headaches that do not go away when you take medicine. You have vision changes. Summary The second trimester is from week 14 through week 27 (months 4 through 6). It is also a time when the fetus is growing rapidly. Your body goes through many changes during pregnancy. The changes vary from woman to woman. Avoid all smoking, herbs, alcohol, and unprescribed drugs. These chemicals affect the formation and growth your baby. Do not use any tobacco products, such as cigarettes, chewing tobacco, and e-cigarettes. If you need help quitting, ask your health care provider. Contact your health care provider if you have any questions. Keep all prenatal visits as told by your health care provider. This is important. This information is not intended to replace advice given to you by your health care provider. Make sure you discuss any questions you have with your health care provider. Document Released: 10/30/2001 Document Revised: 04/12/2016 Document Reviewed: 01/06/2013 Elsevier Interactive Patient Education  2017 Elsevier Inc.  

## 2022-10-17 NOTE — Progress Notes (Signed)
[place] 20 wks,breech,fundal placenta gr 0,normal ovaries,LVEICF,RVEICF,CX 3.8 cm,SVP of fluid 4.2 cm,fhr 154 BPM,EFW 301 25%,anatomy complete

## 2022-11-15 ENCOUNTER — Encounter: Payer: Self-pay | Admitting: Advanced Practice Midwife

## 2022-11-15 ENCOUNTER — Ambulatory Visit: Payer: Managed Care, Other (non HMO) | Admitting: Advanced Practice Midwife

## 2022-11-15 VITALS — BP 115/76 | HR 93 | Wt 230.0 lb

## 2022-11-15 DIAGNOSIS — Z6836 Body mass index (BMI) 36.0-36.9, adult: Secondary | ICD-10-CM

## 2022-11-15 DIAGNOSIS — Z3A24 24 weeks gestation of pregnancy: Secondary | ICD-10-CM

## 2022-11-15 DIAGNOSIS — Z3402 Encounter for supervision of normal first pregnancy, second trimester: Secondary | ICD-10-CM

## 2022-11-15 NOTE — Progress Notes (Signed)
   LOW-RISK PREGNANCY VISIT Patient name: Christina Schultz MRN [id number]  Date of birth: 08-Jun-2001 Chief Complaint:   Routine Prenatal Visit  History of Present Illness:   Christina Schultz is a 21 y.o. G1P0 female at [redacted]w[redacted]d with an Estimated Date of Delivery: 03/06/23 being seen today for ongoing management of a low-risk pregnancy.  Today she reports no complaints. Contractions: Not present. Vag. Bleeding: None.  Movement: Present. denies leaking of fluid. Review of Systems:   Pertinent items are noted in HPI Denies abnormal vaginal discharge w/ itching/odor/irritation, headaches, visual changes, shortness of breath, chest pain, abdominal pain, severe nausea/vomiting, or problems with urination or bowel movements unless otherwise stated above. Pertinent History Reviewed:  Reviewed past medical,surgical, social, obstetrical and family history.  Reviewed problem list, medications and allergies. Physical Assessment:   Vitals:   11/15/22 0838  BP: 115/76  Pulse: 93  Weight: 230 lb (104.3 kg)  Body mass index is 37.12 kg/m.        Physical Examination:   General appearance: Well appearing, and in no distress  Mental status: Alert, oriented to person, place, and time  Skin: Warm & dry  Cardiovascular: Normal heart rate noted  Respiratory: Normal respiratory effort, no distress  Abdomen: Soft, gravid, nontender  Pelvic: Cervical exam deferred         Extremities:    Fetal Status:     Movement: Present    Chaperone:  N/A    No results found for this or any previous visit (from the past 24 hour(s)).  Assessment & Plan:  1) Low-risk pregnancy G1P0 at [redacted]w[redacted]d with an Estimated Date of Delivery: 03/06/23   2) Obesity, EFW 32 weeks (scheduled); weight is good, appetite decreased some but meal review is appropriate   Meds: No orders of the defined types were placed in this encounter.  Labs/procedures today: none  Plan:  Continue routine obstetrical care  Next visit: prefers in person     Reviewed: Preterm labor symptoms and general obstetric precautions including but not limited to vaginal bleeding, contractions, leaking of fluid and fetal movement were reviewed in detail with the patient.  All questions were answered. as home bp cuff. . Check bp weekly, let us know if >140/90.   Follow-up: Return in about 4 weeks (around 12/13/2022) for PN2/LROB; 8 weeks [place] for EFW/LROB.  Future Appointments  Date Time Provider Department Center  12/14/2022  8:30 AM [facility]  12/14/2022  9:10 AM Lazaro Arms, MD [facility]  01/10/2023 10:45 AM [facility] [place] [facility] None  01/10/2023 11:30 AM Marzella Schlein, Scarlette Calico, CNM [facility]    Orders Placed This Encounter  Procedures   US OB Follow Up   Jacklyn Shell DNP, CNM 11/15/2022 9:08 AM

## 2022-11-15 NOTE — Patient Instructions (Signed)

## 2022-11-19 NOTE — L&D Delivery Note (Signed)
OB/GYN Faculty Practice Delivery Note  Christina Schultz is a 22 y.o. G1P0 s/p VD at [redacted]w[redacted]d. She was admitted for SOL/SROM.   ROM: 8h 60m with thick meconium fluid GBS Status:  Negative/-- (03/22 1345) Maximum Maternal Temperature: 97.7  Labor Progress: Initial SVE: 8/100/0. She then progressed to complete.   Delivery Date/Time/: 03/02/2023 @0522  Delivery: Called to room and patient was complete and pushing. Head delivered LOA. Tight nuchal cord present x1. Shoulder and body delivered in usual fashion. Infant with spontaneous cry, placed on mother's abdomen, dried and stimulated. Cord clamped x 2 after 1-minute delay, and cut by FOB. Cord blood drawn. Placenta delivered spontaneously with gentle cord traction. Fundus firm with massage and Pitocin. Labia, perineum, vagina, and cervix inspected. There was a 2nd degree perineal laceration that required repair.   Baby Weight: pending  Placenta: 3 vessel, intact. Sent to L&D Complications: None Lacerations: 2nd degree perineal laceration; 2.0 vicryl EBL: 425 mL Analgesia: Epidural   Infant:  APGAR (1 MIN): 8  APGAR (5 MINS):  9  Christina Briley Autry-Lott, DO OB Fellow, Faculty [employer], Center for [facility] 03/02/2023, 8:47 AM

## 2022-11-20 ENCOUNTER — Encounter: Payer: Self-pay | Admitting: Advanced Practice Midwife

## 2022-12-13 ENCOUNTER — Encounter (HOSPITAL_COMMUNITY): Payer: Self-pay | Admitting: Obstetrics and Gynecology

## 2022-12-13 ENCOUNTER — Other Ambulatory Visit: Payer: Self-pay

## 2022-12-13 ENCOUNTER — Inpatient Hospital Stay (HOSPITAL_COMMUNITY)
Admission: AD | Admit: 2022-12-13 | Discharge: 2022-12-13 | Disposition: A | Discharge status: Home / Self Care | Payer: Managed Care, Other (non HMO) | Attending: Obstetrics and Gynecology | Admitting: Obstetrics and Gynecology

## 2022-12-13 ENCOUNTER — Encounter: Payer: Self-pay | Admitting: Advanced Practice Midwife

## 2022-12-13 DIAGNOSIS — Z7982 Long term (current) use of aspirin: Secondary | ICD-10-CM | POA: Insufficient documentation

## 2022-12-13 DIAGNOSIS — W010XXA Fall on same level from slipping, tripping and stumbling without subsequent striking against object, initial encounter: Secondary | ICD-10-CM | POA: Insufficient documentation

## 2022-12-13 DIAGNOSIS — Z3A28 28 weeks gestation of pregnancy: Secondary | ICD-10-CM | POA: Diagnosis not present

## 2022-12-13 DIAGNOSIS — O99213 Obesity complicating pregnancy, third trimester: Secondary | ICD-10-CM | POA: Insufficient documentation

## 2022-12-13 DIAGNOSIS — Z5329 Procedure and treatment not carried out because of patient's decision for other reasons: Secondary | ICD-10-CM

## 2022-12-13 DIAGNOSIS — O99891 Other specified diseases and conditions complicating pregnancy: Secondary | ICD-10-CM

## 2022-12-13 DIAGNOSIS — O26893 Other specified pregnancy related conditions, third trimester: Secondary | ICD-10-CM | POA: Diagnosis not present

## 2022-12-13 DIAGNOSIS — W19XXXA Unspecified fall, initial encounter: Secondary | ICD-10-CM | POA: Diagnosis not present

## 2022-12-13 LAB — URINALYSIS, ROUTINE W REFLEX MICROSCOPIC
Bilirubin Urine: NEGATIVE
Glucose, UA: NEGATIVE mg/dL
Hgb urine dipstick: NEGATIVE
Ketones, ur: NEGATIVE mg/dL
Nitrite: NEGATIVE
Protein, ur: NEGATIVE mg/dL
Specific Gravity, Urine: 1.005 (ref 1.005–1.030)
pH: 7 (ref 5.0–8.0)

## 2022-12-13 NOTE — MAU Note (Signed)
Christina Schultz is a 22 y.o. at [redacted]w[redacted]d here in MAU reporting: today while leaving work she slid and fell onto her knees. No cramping, bleeding, or LOF. +FM  Onset of complaint: today around 1600  Pain score: 0/10  Vitals:   12/13/22 1735  BP: 127/81  Pulse: 100  Resp: 16  Temp: 98.8 F (37.1 C)  SpO2: 100%     FHT:150  Lab orders placed from triage: UA

## 2022-12-13 NOTE — MAU Note (Signed)
Pt left AMA. RN and CNM went over risks of leaving AMA and explained the AMA form. Pt verbalized understanding of the AMA form and signed form.

## 2022-12-13 NOTE — MAU Provider Note (Addendum)
History     CSN: [id number]  Arrival date and time: 12/13/22 1718   Event Date/Time   First Provider Initiated Contact with Patient 12/13/22 1756      Chief Complaint  Patient presents with   Fall   HPI  Christina Schultz is a 22 year old G1P0 at [redacted]w[redacted]d gestation presenting after a fall that occurred a couple hours ago. States she was closing the door at work, slipped on the wet ground and landed on her L knee. States she caught herself on the door and did not fully fall on the ground. Denies contact to her belly, abdominal pain or cramping. Denies leakage of fluids, vaginal bleeding and reports normal FM. Advised by OB office to present for obs. Currently asymptomatic.  OB History     Gravida  1   Para      Term      Preterm      AB      Living         SAB      IAB      Ectopic      Multiple      Live Births              Past Medical History:  Diagnosis Date   Medical history non-contributory     Past Surgical History:  Procedure Laterality Date   WISDOM TOOTH EXTRACTION      Family History  Problem Relation Age of Onset   Heart attack Mother    Hypertension Mother    Diabetes Mother    Hypertension Father    Stroke Maternal Grandmother    Cancer Maternal Grandfather    Diabetes Paternal Grandmother    Heart attack Paternal Grandmother     Social History   Tobacco Use   Smoking status: Never   Smokeless tobacco: Never  Vaping Use   Vaping Use: Never used  Substance Use Topics   Alcohol use: Not Currently   Drug use: Never    Allergies: No Known Allergies  Medications Prior to Admission  Medication Sig Dispense Refill Last Dose   aspirin 81 MG chewable tablet Chew 2 tablets (162 mg total) by mouth daily. 60 tablet 7 12/13/2022 at 0530   Prenatal Vit-Fe Fumarate-FA (PRENATAL VITAMIN PO) Take by mouth.   12/13/2022 at 0530    Review of Systems  Constitutional: Negative.  Negative for fatigue and fever.  HENT: Negative.     Respiratory: Negative.  Negative for shortness of breath.   Cardiovascular: Negative.  Negative for chest pain.  Gastrointestinal: Negative.  Negative for abdominal pain, constipation, diarrhea, nausea and vomiting.  Genitourinary: Negative.  Negative for dysuria.  Neurological: Negative.  Negative for dizziness and headaches.   Physical Exam   Blood pressure 125/76, pulse 94, temperature 98.8 F (37.1 C), temperature source Oral, resp. rate 16, height 5\' 6"  (1.676 m), weight 107.9 kg, last menstrual period 05/09/2022, SpO2 97 %.  Physical Exam Vitals and nursing note reviewed.  Constitutional:      General: Well-appearing Eyes:     Extraocular Movements: Extraocular movements intact.     Pupils: Pupils are equal, round, and reactive to light.  Cardiovascular:     Rate and Rhythm: Normal rate.  Pulmonary:     Effort: Pulmonary effort is normal.  Abdominal:     Palpations: Abdomen is soft.     Tenderness: Non-tender to palpation    Comments: Gravid   Musculoskeletal:  General: Full ROM    Cervical back: Normal range of motion.  Skin:    General: Skin is warm and dry. No abrasions or lesions noted Neurological:     General: No focal deficit present.     Mental Status: She is alert and oriented to person, place, and time.  Psychiatric:        Mood and Affect: Mood normal.        Behavior: Behavior normal.   Fetal Tracing: Baseline: 140 Variability: Moderate Accels: 10x10 Decels: None   Toco: none  MAU Course  Procedures  MDM Prenatal records from community office reviewed. Prenatal course complicated by obesity.  Mechanical fall, now asymptomatic. FHR reassuring. Consulted with attending Dr. Nelda Marseille who recommends 4 hour observation period.  2045: Patient requesting to leave. Counseled on risks and patient opted to leave AMA after ~3 hours of obs.  Assessment and Plan   Mechanical fall Third trimester pregnancy Asymptomatic. Obs fetal monitoring x 3 hours  with reassuring FHR. Outpatient f/u scheduled for tomorrow Patient left AMA -Patient may return to MAU as needed or if her condition were to change or worsen  Colletta Maryland 12/13/2022, 6:01 PM   Attestation of Supervision of Student:  I confirm that I have verified the information documented in the  resident  student's note and that I have also personally reperformed the history, physical exam and all medical decision making activities.  I have verified that all services and findings are accurately documented in this student's note; and I agree with management and plan as outlined in the documentation. I have also made any necessary editorial changes.  CNM at bedside to discuss reasons for 4 hours of monitoring. Discussed risk for abruption after a fall and potential implication to fetus. Patient verbalized understanding and states she has to leave.   Strict return precautions reviewed. Patient left against medical advice  Wende Mott, [place] for [employer], [employer] 12/13/2022 8:53 PM

## 2022-12-14 ENCOUNTER — Ambulatory Visit: Payer: Managed Care, Other (non HMO) | Admitting: Obstetrics & Gynecology

## 2022-12-14 ENCOUNTER — Other Ambulatory Visit: Payer: Managed Care, Other (non HMO)

## 2022-12-14 DIAGNOSIS — Z131 Encounter for screening for diabetes mellitus: Secondary | ICD-10-CM

## 2022-12-14 DIAGNOSIS — Z3403 Encounter for supervision of normal first pregnancy, third trimester: Secondary | ICD-10-CM

## 2022-12-14 DIAGNOSIS — Z3A28 28 weeks gestation of pregnancy: Secondary | ICD-10-CM

## 2022-12-15 LAB — RPR: RPR Ser Ql: NONREACTIVE

## 2022-12-15 LAB — CBC
Hematocrit: 34 % (ref 34.0–46.6)
Hemoglobin: 11.4 g/dL (ref 11.1–15.9)
MCH: 30.2 pg (ref 26.6–33.0)
MCHC: 33.5 g/dL (ref 31.5–35.7)
MCV: 90 fL (ref 79–97)
Platelets: 265 10*3/uL (ref 150–450)
RBC: 3.77 x10E6/uL (ref 3.77–5.28)
RDW: 13.4 % (ref 11.7–15.4)
WBC: 11 10*3/uL — ABNORMAL HIGH (ref 3.4–10.8)

## 2022-12-15 LAB — GLUCOSE TOLERANCE, 2 HOURS W/ 1HR
Glucose, 1 hour: 176 mg/dL (ref 70–179)
Glucose, 2 hour: 113 mg/dL (ref 70–152)
Glucose, Fasting: 91 mg/dL (ref 70–91)

## 2022-12-15 LAB — ANTIBODY SCREEN: Antibody Screen: NEGATIVE

## 2022-12-15 LAB — HIV ANTIBODY (ROUTINE TESTING W REFLEX): HIV Screen 4th Generation wRfx: NONREACTIVE

## 2023-01-10 ENCOUNTER — Ambulatory Visit: Payer: Managed Care, Other (non HMO)

## 2023-01-10 ENCOUNTER — Ambulatory Visit: Payer: Managed Care, Other (non HMO) | Admitting: Advanced Practice Midwife

## 2023-01-10 VITALS — BP 127/84 | HR 93 | Wt 237.0 lb

## 2023-01-10 DIAGNOSIS — Z3403 Encounter for supervision of normal first pregnancy, third trimester: Secondary | ICD-10-CM | POA: Diagnosis not present

## 2023-01-10 DIAGNOSIS — Z6836 Body mass index (BMI) 36.0-36.9, adult: Secondary | ICD-10-CM

## 2023-01-10 DIAGNOSIS — Z3402 Encounter for supervision of normal first pregnancy, second trimester: Secondary | ICD-10-CM

## 2023-01-10 DIAGNOSIS — Z3A32 32 weeks gestation of pregnancy: Secondary | ICD-10-CM

## 2023-01-10 NOTE — Patient Instructions (Addendum)
Christina Schultz, I greatly value your feedback.  If you receive a survey following your visit with [place] today, we appreciate you taking the time to fill it out.  Thanks, Nigel Berthold, DNP, CNM  [place]!!! It is now [place] at [facility] ([place], [place] [address]) Entrance located off of [place] parking   Go to [email or web address] to register for FREE online childbirth classes    Call the office [phone]) or go to [place] if: You begin to have strong, frequent contractions Your water breaks.  Sometimes it is a big gush of fluid, sometimes it is just a trickle that keeps getting your panties wet or running down your legs You have vaginal bleeding.  It is normal to have a small amount of spotting if your cervix was checked.  You don't feel your baby moving like normal.  If you don't, get you something to eat and drink and lay down and focus on feeling your baby move.  You should feel at least 10 movements in 2 hours.  If you don't, you should call the office or go to [place] Blood Pressure Monitoring for Patients   Your provider has recommended that you check your blood pressure (BP) at least once a week at home. If you do not have a blood pressure cuff at home, one will be provided for you. Contact your provider if you have not received your monitor within 1 week.   Helpful Tips for Accurate Home Blood Pressure Checks  Don't smoke, exercise, or drink caffeine 30 minutes before checking your BP Use the restroom before checking your BP (a full bladder can raise your pressure) Relax in a comfortable upright chair Feet on the ground Left arm resting comfortably on a flat surface at the level of your heart Legs uncrossed Back supported Sit quietly and don't talk Place the cuff on your bare arm Adjust snuggly, so that only two fingertips can fit between your skin and the top  of the cuff Check 2 readings separated by at least one minute Keep a log of your BP readings For a visual, please reference this diagram: http://ccnc.care/bpdiagram  Provider Name: [facility]     Phone: [phone]  Zone 1: ALL CLEAR  Continue to monitor your symptoms:  BP reading is less than 140 (top number) or less than 90 (bottom number)  No right upper stomach pain No headaches or seeing spots No feeling nauseated or throwing up No swelling in face and hands  Zone 2: CAUTION Call your doctor's office for any of the following:  BP reading is greater than 140 (top number) or greater than 90 (bottom number)  Stomach pain under your ribs in the middle or right side Headaches or seeing spots Feeling nauseated or throwing up Swelling in face and hands  Zone 3: EMERGENCY  Seek immediate medical care if you have any of the following:  BP reading is greater than160 (top number) or greater than 110 (bottom number) Severe headaches not improving with Tylenol Serious difficulty catching your breath Any worsening symptoms from Zone 2    Safe Medications in Pregnancy   Acne: Benzoyl Peroxide Salicylic Acid  Backache/Headache: Tylenol: 2 regular strength every 4 hours OR              2 Extra strength every 6 hours  Colds/Coughs/Allergies: Benadryl (alcohol free) 25 mg every 6 hours as  needed Breath right strips Claritin Cepacol throat lozenges Chloraseptic throat spray Cold-Eeze- up to three times per day Cough drops, alcohol free Flonase (by prescription only) Guaifenesin Mucinex Robitussin DM (plain only, alcohol free) Saline nasal spray/drops Sudafed (pseudoephedrine) & Actifed ** use only after [redacted] weeks gestation and if you do not have high blood pressure Tylenol Vicks Vaporub Zinc lozenges Zyrtec   Constipation: Colace Ducolax suppositories Fleet enema Glycerin suppositories Metamucil Milk of magnesia Miralax Senokot Smooth move  tea  Diarrhea: Kaopectate Imodium A-D  *NO pepto Bismol  Hemorrhoids: Anusol Anusol HC Preparation H Tucks  Indigestion: Tums Maalox Mylanta Zantac  Pepcid  Insomnia: Benadryl (alcohol free) 46m every 6 hours as needed Tylenol PM Unisom, no Gelcaps  Leg Cramps: Tums MagGel  Nausea/Vomiting:  Bonine Dramamine Emetrol Ginger extract Sea bands Meclizine  Nausea medication to take during pregnancy:  Unisom (doxylamine succinate 25 mg tablets) Take one tablet daily at bedtime. If symptoms are not adequately controlled, the dose can be increased to a maximum recommended dose of two tablets daily (1/2 tablet in the morning, 1/2 tablet mid-afternoon and one at bedtime). Vitamin B6 1069mtablets. Take one tablet twice a day (up to 200 mg per day).  Skin Rashes: Aveeno products Benadryl cream or 2556mvery 6 hours as needed Calamine Lotion 1% cortisone cream  Yeast infection: Gyne-lotrimin 7 Monistat 7   **If taking multiple medications, please check labels to avoid duplicating the same active ingredients **take medication as directed on the label ** Do not exceed 4000 mg of tylenol in 24 hours **Do not take medications that contain aspirin or ibuprofen

## 2023-01-10 NOTE — Progress Notes (Signed)
   LOW-RISK PREGNANCY VISIT Patient name: Christina Schultz MRN [id number]  Date of birth: 10/28/2001 Chief Complaint:   Routine Prenatal Visit  History of Present Illness:   Christina Schultz is a 22 y.o. G1P0 female at 40w1dwith an Estimated Date of Delivery: 03/06/23 being seen today for ongoing management of a low-risk pregnancy.  Today she reports some leg/hip. Contractions: Not present. Vag. Bleeding: None.  Movement: Present. denies leaking of fluid. Review of Systems:   Pertinent items are noted in HPI Denies abnormal vaginal discharge w/ itching/odor/irritation, headaches, visual changes, shortness of breath, chest pain, abdominal pain, severe nausea/vomiting, or problems with urination or bowel movements unless otherwise stated above. Pertinent History Reviewed:  Reviewed past medical,surgical, social, obstetrical and family history.  Reviewed problem list, medications and allergies. Physical Assessment:   Vitals:   01/10/23 1133  BP: 127/84  Pulse: 93  Weight: 237 lb (107.5 kg)  Body mass index is 38.25 kg/m.        Physical Examination:   General appearance: Well appearing, and in no distress  Mental status: Alert, oriented to person, place, and time  Skin: Warm & dry  Cardiovascular: Normal heart rate noted  Respiratory: Normal respiratory effort, no distress  Abdomen: Soft, gravid, nontender  Pelvic: Cervical exam deferred         Extremities:    Fetal Status:     Movement: Present  [id number],cephalic,posterior placenta gr 0,FHR 126 bpm,AFI 20 cm,EFW 2000 g 53%   Chaperone:  N/A    No results found for this or any previous visit (from the past 24 hour(s)).  Assessment & Plan:    Pregnancy: G1P0 at 374w1d. Encounter for supervision of normal first pregnancy in third trimester      Meds: No orders of the defined types were placed in this encounter.  Labs/procedures today: EFW  Plan:  Continue routine obstetrical care  Next visit: prefers in person     Reviewed: Preterm labor symptoms and general obstetric precautions including but not limited to vaginal bleeding, contractions, leaking of fluid and fetal movement were reviewed in detail with the patient.  All questions were answered. Has home bp cuff. Check bp weekly, let usKoreanow if >140/90.   Follow-up: Return in about 2 weeks (around 01/24/2023) [place] future appointments.  No orders of the defined types were placed in this encounter.  FrChristin FudgeNP, CNM 01/10/2023 11:58 AM

## 2023-01-10 NOTE — Progress Notes (Signed)
[place] [id number] wks,cephalic,posterior placenta gr 0,FHR 126 bpm,AFI 20 cm,EFW 2000 g 53%

## 2023-01-24 ENCOUNTER — Ambulatory Visit: Payer: Managed Care, Other (non HMO) | Admitting: Advanced Practice Midwife

## 2023-01-24 ENCOUNTER — Encounter: Payer: Self-pay | Admitting: Advanced Practice Midwife

## 2023-01-24 VITALS — BP 120/79 | HR 108 | Wt 242.0 lb

## 2023-01-24 DIAGNOSIS — F5089 Other specified eating disorder: Secondary | ICD-10-CM

## 2023-01-24 DIAGNOSIS — Z3A34 34 weeks gestation of pregnancy: Secondary | ICD-10-CM

## 2023-01-24 DIAGNOSIS — Z3403 Encounter for supervision of normal first pregnancy, third trimester: Secondary | ICD-10-CM

## 2023-01-24 LAB — POCT HEMOGLOBIN: Hemoglobin: 12.3 g/dL (ref 11–14.6)

## 2023-01-24 NOTE — Progress Notes (Signed)
   LOW-RISK PREGNANCY VISIT Patient name: Christina Schultz MRN [id number]  Date of birth: Jun 19, 2001 Chief Complaint:   Routine Prenatal Visit  History of Present Illness:   Christina Schultz is a 22 y.o. G1P0 female at 54w1dwith an Estimated Date of Delivery: 03/06/23 being seen today for ongoing management of a low-risk pregnancy.  Today she reports craving ice. Contractions: Not present.  .  Movement: Present. denies leaking of fluid. Review of Systems:   Pertinent items are noted in HPI Denies abnormal vaginal discharge w/ itching/odor/irritation, headaches, visual changes, shortness of breath, chest pain, abdominal pain, severe nausea/vomiting, or problems with urination or bowel movements unless otherwise stated above. Pertinent History Reviewed:  Reviewed past medical,surgical, social, obstetrical and family history.  Reviewed problem list, medications and allergies. Physical Assessment:   Vitals:   01/24/23 1329  BP: 120/79  Pulse: (!) 108  Weight: 242 lb (109.8 kg)  Body mass index is 39.06 kg/m.        Physical Examination:   General appearance: Well appearing, and in no distress  Mental status: Alert, oriented to person, place, and time  Skin: Warm & dry  Cardiovascular: Normal heart rate noted  Respiratory: Normal respiratory effort, no distress  Abdomen: Soft, gravid, nontender  Pelvic: Cervical exam deferred         Extremities: Edema: Trace  Fetal Status:     Movement: Present    Chaperone:  N/A     Results for orders placed or performed in visit on 01/24/23 (from the past 24 hour(s))  POCT hemoglobin   Collection Time: 01/24/23  1:50 PM  Result Value Ref Range   Hemoglobin 12.3 11 - 14.6 g/dL    Assessment & Plan:    Pregnancy: G1P0 at 383w1d. Encounter for supervision of normal first pregnancy in third trimester      Meds: No orders of the defined types were placed in this encounter.  Labs/procedures today: none  Plan:  Continue routine obstetrical  care  Next visit: prefers in person    Reviewed: Preterm labor symptoms and general obstetric precautions including but not limited to vaginal bleeding, contractions, leaking of fluid and fetal movement were reviewed in detail with the patient.  All questions were answered. Has home bp cuff.. Check bp weekly, let usKoreanow if >140/90.   Follow-up: Return in about 2 weeks (around 02/07/2023) for [place] Future Appointments  Date Time Provider DeReardan3/22/2024 11:50 AM OzJanyth PupaDO [facility]    Orders Placed This Encounter  Procedures   POCT hemoglobin   FrChristin FudgeNP, CNM 01/24/2023 2:06 PM

## 2023-01-24 NOTE — Patient Instructions (Signed)
Christina Schultz, I greatly value your feedback.  If you receive a survey following your visit with [place] today, we appreciate you taking the time to fill it out.  Thanks, Nigel Berthold, DNP, CNM  [place]!!! It is now [place] at [facility] ([place], [place] [address]) Entrance located off of [place] parking   Go to [email or web address] to register for FREE online childbirth classes    Call the office [phone]) or go to [place] if: You begin to have strong, frequent contractions Your water breaks.  Sometimes it is a big gush of fluid, sometimes it is just a trickle that keeps getting your panties wet or running down your legs You have vaginal bleeding.  It is normal to have a small amount of spotting if your cervix was checked.  You don't feel your baby moving like normal.  If you don't, get you something to eat and drink and lay down and focus on feeling your baby move.  You should feel at least 10 movements in 2 hours.  If you don't, you should call the office or go to [place] Blood Pressure Monitoring for Patients   Your provider has recommended that you check your blood pressure (BP) at least once a week at home. If you do not have a blood pressure cuff at home, one will be provided for you. Contact your provider if you have not received your monitor within 1 week.   Helpful Tips for Accurate Home Blood Pressure Checks  Don't smoke, exercise, or drink caffeine 30 minutes before checking your BP Use the restroom before checking your BP (a full bladder can raise your pressure) Relax in a comfortable upright chair Feet on the ground Left arm resting comfortably on a flat surface at the level of your heart Legs uncrossed Back supported Sit quietly and don't talk Place the cuff on your bare arm Adjust snuggly, so that only two fingertips can fit between your skin and the top  of the cuff Check 2 readings separated by at least one minute Keep a log of your BP readings For a visual, please reference this diagram: http://ccnc.care/bpdiagram  Provider Name: [facility]     Phone: [phone]  Zone 1: ALL CLEAR  Continue to monitor your symptoms:  BP reading is less than 140 (top number) or less than 90 (bottom number)  No right upper stomach pain No headaches or seeing spots No feeling nauseated or throwing up No swelling in face and hands  Zone 2: CAUTION Call your doctor's office for any of the following:  BP reading is greater than 140 (top number) or greater than 90 (bottom number)  Stomach pain under your ribs in the middle or right side Headaches or seeing spots Feeling nauseated or throwing up Swelling in face and hands  Zone 3: EMERGENCY  Seek immediate medical care if you have any of the following:  BP reading is greater than160 (top number) or greater than 110 (bottom number) Severe headaches not improving with Tylenol Serious difficulty catching your breath Any worsening symptoms from Zone 2

## 2023-01-31 ENCOUNTER — Encounter: Payer: Self-pay | Admitting: Advanced Practice Midwife

## 2023-02-08 ENCOUNTER — Ambulatory Visit (INDEPENDENT_AMBULATORY_CARE_PROVIDER_SITE_OTHER): Payer: Managed Care, Other (non HMO) | Admitting: Advanced Practice Midwife

## 2023-02-08 ENCOUNTER — Other Ambulatory Visit (HOSPITAL_COMMUNITY)
Admission: RE | Admit: 2023-02-08 | Discharge: 2023-02-08 | Disposition: A | Discharge status: Home / Self Care | Payer: Managed Care, Other (non HMO) | Source: Ambulatory Visit | Attending: Obstetrics & Gynecology | Admitting: Obstetrics & Gynecology

## 2023-02-08 VITALS — BP 124/81 | HR 93 | Wt 246.0 lb

## 2023-02-08 DIAGNOSIS — Z3A36 36 weeks gestation of pregnancy: Secondary | ICD-10-CM

## 2023-02-08 DIAGNOSIS — Z3403 Encounter for supervision of normal first pregnancy, third trimester: Secondary | ICD-10-CM

## 2023-02-08 NOTE — Progress Notes (Signed)
   LOW-RISK PREGNANCY VISIT Patient name: Christina Schultz MRN [id number]  Date of birth: [birth year] Chief Complaint:   Routine Prenatal Visit  History of Present Illness:   Christina Schultz is a 22 y.o. G1P0 female at [redacted]w[redacted]d with an Estimated Date of Delivery: 03/06/23 being seen today for ongoing management of a low-risk pregnancy.  Today she reports  some R lower quad discomfort when lying on back . Contractions: Not present. Vag. Bleeding: None.  Movement: Present. denies leaking of fluid. Review of Systems:   Pertinent items are noted in HPI Denies abnormal vaginal discharge w/ itching/odor/irritation, headaches, visual changes, shortness of breath, chest pain, abdominal pain, severe nausea/vomiting, or problems with urination or bowel movements unless otherwise stated above. Pertinent History Reviewed:  Reviewed past medical,surgical, social, obstetrical and family history.  Reviewed problem list, medications and allergies. Physical Assessment:   Vitals:   02/08/23 1135  BP: 124/81  Pulse: 93  Weight: 246 lb (111.6 kg)  Body mass index is 39.71 kg/m.        Physical Examination:   General appearance: Well appearing, and in no distress  Mental status: Alert, oriented to person, place, and time  Skin: Warm & dry  Cardiovascular: Normal heart rate noted  Respiratory: Normal respiratory effort, no distress  Abdomen: Soft, gravid, nontender  Pelvic: Cervical exam deferred         Extremities: Edema: Trace  Fetal Status: Fetal Heart Rate (bpm): 137 Fundal Height: 38 cm Movement: Present    No results found for this or any previous visit (from the past 24 hour(s)).  Assessment & Plan:  1) Low-risk pregnancy G1P0 at [redacted]w[redacted]d with an Estimated Date of Delivery: 03/06/23     Meds: No orders of the defined types were placed in this encounter.  Labs/procedures today: GBS/cultures  Plan:  Continue routine obstetrical care   Reviewed: Term labor symptoms and general obstetric precautions  including but not limited to vaginal bleeding, contractions, leaking of fluid and fetal movement were reviewed in detail with the patient.  All questions were answered. Has home bp cuff. Check bp weekly, let us know if >140/90.   Follow-up: Return in about 1 week (around 02/15/2023) for LROB, in person x 3 weeks.  Orders Placed This Encounter  Procedures   Culture, beta strep (group b only)   Myrtis Ser [facility] 02/08/2023 11:49 AM

## 2023-02-10 ENCOUNTER — Encounter (HOSPITAL_COMMUNITY): Payer: Self-pay | Admitting: Obstetrics and Gynecology

## 2023-02-10 ENCOUNTER — Inpatient Hospital Stay (HOSPITAL_COMMUNITY)
Admission: AD | Admit: 2023-02-10 | Discharge: 2023-02-10 | Disposition: A | Discharge status: Home / Self Care | Payer: Managed Care, Other (non HMO) | Attending: Obstetrics and Gynecology | Admitting: Obstetrics and Gynecology

## 2023-02-10 ENCOUNTER — Other Ambulatory Visit: Payer: Self-pay

## 2023-02-10 DIAGNOSIS — O10913 Unspecified pre-existing hypertension complicating pregnancy, third trimester: Secondary | ICD-10-CM | POA: Diagnosis not present

## 2023-02-10 DIAGNOSIS — R030 Elevated blood-pressure reading, without diagnosis of hypertension: Secondary | ICD-10-CM | POA: Insufficient documentation

## 2023-02-10 DIAGNOSIS — O26893 Other specified pregnancy related conditions, third trimester: Secondary | ICD-10-CM | POA: Insufficient documentation

## 2023-02-10 DIAGNOSIS — O99613 Diseases of the digestive system complicating pregnancy, third trimester: Secondary | ICD-10-CM | POA: Insufficient documentation

## 2023-02-10 DIAGNOSIS — Z3A36 36 weeks gestation of pregnancy: Secondary | ICD-10-CM | POA: Diagnosis not present

## 2023-02-10 DIAGNOSIS — K219 Gastro-esophageal reflux disease without esophagitis: Secondary | ICD-10-CM

## 2023-02-10 DIAGNOSIS — Z3689 Encounter for other specified antenatal screening: Secondary | ICD-10-CM

## 2023-02-10 LAB — COMPREHENSIVE METABOLIC PANEL
ALT: 11 U/L (ref 0–44)
AST: 16 U/L (ref 15–41)
Albumin: 2.6 g/dL — ABNORMAL LOW (ref 3.5–5.0)
Alkaline Phosphatase: 126 U/L (ref 38–126)
Anion gap: 15 (ref 5–15)
BUN: 5 mg/dL — ABNORMAL LOW (ref 6–20)
CO2: 20 mmol/L — ABNORMAL LOW (ref 22–32)
Calcium: 9.1 mg/dL (ref 8.9–10.3)
Chloride: 101 mmol/L (ref 98–111)
Creatinine, Ser: 0.57 mg/dL (ref 0.44–1.00)
GFR, Estimated: >60 mL/min (ref >60)
Glucose, Bld: 111 mg/dL — ABNORMAL HIGH (ref 70–99)
Potassium: 3.4 mmol/L — ABNORMAL LOW (ref 3.5–5.1)
Sodium: 136 mmol/L (ref 135–145)
Total Bilirubin: 0.4 mg/dL (ref 0.3–1.2)
Total Protein: 5.9 g/dL — ABNORMAL LOW (ref 6.5–8.1)

## 2023-02-10 LAB — PROTEIN / CREATININE RATIO, URINE
Creatinine, Urine: 162 mg/dL
Protein Creatinine Ratio: 0.08 mg/mg{Cre} (ref 0.00–0.15)
Total Protein, Urine: 13 mg/dL

## 2023-02-10 LAB — URINALYSIS, ROUTINE W REFLEX MICROSCOPIC
Bilirubin Urine: NEGATIVE
Glucose, UA: NEGATIVE mg/dL
Hgb urine dipstick: NEGATIVE
Ketones, ur: 5 mg/dL — AB
Leukocytes,Ua: NEGATIVE
Nitrite: NEGATIVE
Protein, ur: NEGATIVE mg/dL
Specific Gravity, Urine: 1.02 (ref 1.005–1.030)
pH: 6 (ref 5.0–8.0)

## 2023-02-10 LAB — CBC
HCT: 32.9 % — ABNORMAL LOW (ref 36.0–46.0)
Hemoglobin: 11.4 g/dL — ABNORMAL LOW (ref 12.0–15.0)
MCH: 30.2 pg (ref 26.0–34.0)
MCHC: 34.7 g/dL (ref 30.0–36.0)
MCV: 87 fL (ref 80.0–100.0)
Platelets: 241 10*3/uL (ref 150–400)
RBC: 3.78 MIL/uL — ABNORMAL LOW (ref 3.87–5.11)
RDW: 14.3 % (ref 11.5–15.5)
WBC: 9.1 10*3/uL (ref 4.0–10.5)
nRBC: 0 % (ref 0.0–0.2)

## 2023-02-10 LAB — TROPONIN I (HIGH SENSITIVITY): Troponin I (High Sensitivity): 3 ng/L (ref <18)

## 2023-02-10 NOTE — MAU Note (Signed)
Pt reports chest discomfort has resolved. Awaiting labs

## 2023-02-10 NOTE — MAU Note (Signed)
Pt is in no acute distress, respirations are even and unlabored.

## 2023-02-10 NOTE — MAU Provider Note (Signed)
History     CSN: [id number]  Arrival date and time: 02/10/23 2023   Event Date/Time   First Provider Initiated Contact with Patient 02/10/23 2114      Chief Complaint  Patient presents with   Chest Pain   Hypertension   22 y.o. G1 @36 .4 wks presenting with elevated BP and chest pain. Reports onset of pressure in her chest that radiated under her right breast about 2 hrs ago. Sx occurred after she had eaten spicy wings. Had SOB at the time but both sx have since resolved. She checked her BP and was found to be 170/104. Denies HA, visual disturbances, RUQ pain. She also reports breaking out into rash and hives last night. She took Benadryl and sx resolved. Denies any trouble breathing at the time. Denies any allergies. She is unsure what she may have come in contact with but was around some dogs. Reports good FM. No pregnancy complaints.       OB History     Gravida  1   Para      Term      Preterm      AB      Living         SAB      IAB      Ectopic      Multiple      Live Births              Past Medical History:  Diagnosis Date   Medical history non-contributory     Past Surgical History:  Procedure Laterality Date   WISDOM TOOTH EXTRACTION      Family History  Problem Relation Age of Onset   Heart attack Mother    Hypertension Mother    Diabetes Mother    Hypertension Father    Stroke Maternal Grandmother    Cancer Maternal Grandfather    Diabetes Paternal Grandmother    Heart attack Paternal Grandmother     Social History   Tobacco Use   Smoking status: Never   Smokeless tobacco: Never  Vaping Use   Vaping Use: Never used  Substance Use Topics   Alcohol use: Not Currently   Drug use: Never    Allergies: No Known Allergies  No medications prior to admission.    Review of Systems  HENT:  Negative for congestion.   Eyes:  Negative for visual disturbance.  Respiratory:  Positive for shortness of breath. Negative for cough.    Cardiovascular:  Positive for chest pain.  Gastrointestinal:  Negative for abdominal pain.  Neurological:  Negative for headaches.   Physical Exam   Blood pressure 137/89, pulse (!) 106, temperature 98.6 F (37 C), resp. rate 18, height 5\' 6"  (1.676 m), weight 111.7 kg, last menstrual period 05/09/2022, SpO2 99 %. Patient Vitals for the past 24 hrs:  BP Temp Pulse Resp SpO2 Height Weight  02/10/23 2305 -- -- -- -- 99 % -- --  02/10/23 2301 137/89 -- (!) 106 18 -- -- --  02/10/23 2300 -- -- -- -- 99 % -- --  02/10/23 2255 -- -- -- -- 98 % -- --  02/10/23 2250 -- -- -- -- 99 % -- --  02/10/23 2246 131/83 -- 93 -- -- -- --  02/10/23 2245 -- -- -- -- 98 % -- --  02/10/23 2240 -- -- -- -- 98 % -- --  02/10/23 2220 -- -- -- -- 99 % -- --  02/10/23 2216 129/86 -- 89 -- -- -- --  02/10/23 2215 -- -- -- -- 98 % -- --  02/10/23 2210 -- -- -- -- 98 % -- --  02/10/23 2205 -- -- -- -- 99 % -- --  02/10/23 2202 137/85 -- 94 -- -- -- --  02/10/23 2150 -- -- -- -- 98 % -- --  02/10/23 2146 134/77 -- 90 -- -- -- --  02/10/23 2145 -- -- -- -- 98 % -- --  02/10/23 2140 -- -- -- -- 98 % -- --  02/10/23 2135 -- -- -- -- 99 % -- --  02/10/23 2131 131/76 -- 90 -- -- -- --  02/10/23 2130 -- -- -- -- 99 % -- --  02/10/23 2120 -- -- -- -- 99 % -- --  02/10/23 2116 126/84 -- 95 -- -- -- --  02/10/23 2109 126/85 -- 96 17 99 % -- --  02/10/23 2045 126/87 98.6 F (37 C) (!) 106 19 99 % 5\' 6"  (1.676 m) 111.7 kg    Physical Exam Constitutional:      General: She is not in acute distress.    Appearance: Normal appearance.  HENT:     Head: Normocephalic and atraumatic.  Cardiovascular:     Rate and Rhythm: Normal rate and regular rhythm.     Heart sounds: Normal heart sounds.  Pulmonary:     Effort: Pulmonary effort is normal. No respiratory distress.     Breath sounds: Normal breath sounds. No stridor. No wheezing or rhonchi.  Musculoskeletal:        General: Normal range of motion.      Cervical back: Normal range of motion.  Skin:    General: Skin is warm and dry.  Neurological:     General: No focal deficit present.     Mental Status: She is alert and oriented to person, place, and time.  Psychiatric:        Mood and Affect: Mood normal.        Behavior: Behavior normal.   EFM: 130 bpm, mod variability, + accels, no decels Toco: UI  Results for orders placed or performed during the hospital encounter of 02/10/23 (from the past 24 hour(s))  CBC     Status: Abnormal   Collection Time: 02/10/23  9:15 PM  Result Value Ref Range   WBC 9.1 4.0 - 10.5 K/uL   RBC 3.78 (L) 3.87 - 5.11 MIL/uL   Hemoglobin 11.4 (L) 12.0 - 15.0 g/dL   HCT 32.9 (L) 36.0 - 46.0 %   MCV 87.0 80.0 - 100.0 fL   MCH 30.2 26.0 - 34.0 pg   MCHC 34.7 30.0 - 36.0 g/dL   RDW 14.3 11.5 - 15.5 %   Platelets 241 150 - 400 K/uL   nRBC 0.0 0.0 - 0.2 %  Comprehensive metabolic panel     Status: Abnormal   Collection Time: 02/10/23  9:15 PM  Result Value Ref Range   Sodium 136 135 - 145 mmol/L   Potassium 3.4 (L) 3.5 - 5.1 mmol/L   Chloride 101 98 - 111 mmol/L   CO2 20 (L) 22 - 32 mmol/L   Glucose, Bld 111 (H) 70 - 99 mg/dL   BUN 5 (L) 6 - 20 mg/dL   Creatinine, Ser 0.57 0.44 - 1.00 mg/dL   Calcium 9.1 8.9 - 10.3 mg/dL   Total Protein 5.9 (L) 6.5 - 8.1 g/dL   Albumin 2.6 (L) 3.5 - 5.0 g/dL   AST 16 15 - 41 U/L   ALT 11  0 - 44 U/L   Alkaline Phosphatase 126 38 - 126 U/L   Total Bilirubin 0.4 0.3 - 1.2 mg/dL   GFR, Estimated >60 >60 mL/min   Anion gap 15 5 - 15  Troponin I (High Sensitivity)     Status: None   Collection Time: 02/10/23  9:15 PM  Result Value Ref Range   Troponin I (High Sensitivity) 3 <18 ng/L  Urinalysis, Routine w reflex microscopic -Urine, Clean Catch     Status: Abnormal   Collection Time: 02/10/23  9:20 PM  Result Value Ref Range   Color, Urine YELLOW YELLOW   APPearance HAZY (A) CLEAR   Specific Gravity, Urine 1.020 1.005 - 1.030   pH 6.0 5.0 - 8.0   Glucose, UA  NEGATIVE NEGATIVE mg/dL   Hgb urine dipstick NEGATIVE NEGATIVE   Bilirubin Urine NEGATIVE NEGATIVE   Ketones, ur 5 (A) NEGATIVE mg/dL   Protein, ur NEGATIVE NEGATIVE mg/dL   Nitrite NEGATIVE NEGATIVE   Leukocytes,Ua NEGATIVE NEGATIVE  Protein / creatinine ratio, urine     Status: None   Collection Time: 02/10/23  9:20 PM  Result Value Ref Range   Creatinine, Urine 162 mg/dL   Total Protein, Urine 13 mg/dL   Protein Creatinine Ratio 0.08 0.00 - 0.15 mg/mg[Cre]   MAU Course  Procedures  MDM Labs ordered and reviewed.   EKG read by Dr. Nira Retort normal  No signs of HTN or PEC today, although some BPs borderline CP likely GERD Stable for discharge home Assessment and Plan   1. [redacted] weeks gestation of pregnancy   2. NST (non-stress test) reactive   3. Gastroesophageal reflux disease without esophagitis    Discharge home Follow up at [facility] as scheduled PEC precautions  Allergies as of 02/10/2023   No Known Allergies      Medication List     TAKE these medications    aspirin 81 MG chewable tablet Chew 2 tablets (162 mg total) by mouth daily.   PRENATAL VITAMIN PO Take by mouth.       Julianne Handler, CNM 02/10/2023, 11:46 PM

## 2023-02-10 NOTE — MAU Note (Signed)
.  Christina Schultz is a 22 y.o. at [redacted]w[redacted]d here in MAU reporting: last night started itching, woke up with hives/rash all over, chest pain, and elevated BP "140/80" - checked BP around 1930 171/104, "slight" HA. Reports chest pain will start in middle but will radiate to right side of chest, underneath her breast. Denies previous concerns with BP, complications with pregnancy, visual changes, VB, LOF, ctx, or DFM.   Pain score: 1 - HA; 5 - chest  Vitals:   02/10/23 2045  BP: 126/87  Pulse: (!) 106  Resp: 19  Temp: 98.6 F (37 C)  SpO2: 99%     FHT:142 Lab orders placed from triage:  UA

## 2023-02-11 ENCOUNTER — Ambulatory Visit (INDEPENDENT_AMBULATORY_CARE_PROVIDER_SITE_OTHER): Payer: Managed Care, Other (non HMO) | Admitting: *Deleted

## 2023-02-11 ENCOUNTER — Encounter: Payer: Self-pay | Admitting: Women's Health

## 2023-02-11 VITALS — BP 126/84 | HR 91

## 2023-02-11 DIAGNOSIS — Z3403 Encounter for supervision of normal first pregnancy, third trimester: Secondary | ICD-10-CM

## 2023-02-11 DIAGNOSIS — Z6836 Body mass index (BMI) 36.0-36.9, adult: Secondary | ICD-10-CM

## 2023-02-11 LAB — CERVICOVAGINAL ANCILLARY ONLY
Chlamydia: NEGATIVE
Comment: NEGATIVE
Comment: NORMAL
Neisseria Gonorrhea: NEGATIVE

## 2023-02-11 NOTE — Progress Notes (Addendum)
   NURSE VISIT- BLOOD PRESSURE CHECK  SUBJECTIVE:  Christina Schultz is a 22 y.o. G1P0 female here for BP check. She is [redacted]w[redacted]d pregnant    HYPERTENSION ROS:  Pregnant:  Severe headaches that don't go away with tylenol/other medicines:  slight headache today but has not tried taking Tylenol. Has taken it over the weekend for a headache but it did not help Visual changes (seeing spots/double/blurred vision) not today but a few days ago Severe pain under right breast breast or in center of upper chest No  Severe nausea/vomiting No  Taking medicines as instructed not applicable    OBJECTIVE:  BP 126/84   Pulse 91   LMP 05/09/2022   Patient's cuff 143/91 Right arm, 148/93 Left arm Appearance alert, well appearing, and in no distress.  ASSESSMENT: Pregnancy [redacted]w[redacted]d  blood pressure check  PLAN: Discussed with Knute Neu, CNM, [facility]   Recommendations: no changes needed   Follow-up: as scheduled   Alice Rieger  02/11/2023 4:24 PM

## 2023-02-12 LAB — CULTURE, BETA STREP (GROUP B ONLY): Strep Gp B Culture: NEGATIVE

## 2023-02-18 ENCOUNTER — Encounter: Payer: Self-pay | Admitting: Women's Health

## 2023-02-18 ENCOUNTER — Ambulatory Visit (INDEPENDENT_AMBULATORY_CARE_PROVIDER_SITE_OTHER): Payer: Managed Care, Other (non HMO) | Admitting: Women's Health

## 2023-02-18 VITALS — BP 131/83 | HR 88 | Wt 248.0 lb

## 2023-02-18 DIAGNOSIS — Z3A37 37 weeks gestation of pregnancy: Secondary | ICD-10-CM

## 2023-02-18 DIAGNOSIS — Z3403 Encounter for supervision of normal first pregnancy, third trimester: Secondary | ICD-10-CM

## 2023-02-18 NOTE — Progress Notes (Signed)
    LOW-RISK PREGNANCY VISIT Patient name: Christina Schultz MRN [id number]  Date of birth: [birth year] Chief Complaint:   Routine Prenatal Visit  History of Present Illness:   Christina Schultz is a 22 y.o. G1P0 female at [redacted]w[redacted]d with an Estimated Date of Delivery: 03/06/23 being seen today for ongoing management of a low-risk pregnancy.   Today she reports no complaints. Contractions: Not present. Vag. Bleeding: None.  Movement: Present. denies leaking of fluid.     07/26/2022    2:02 PM  Depression screen PHQ 2/9  Decreased Interest 0  Down, Depressed, Hopeless 0  PHQ - 2 Score 0  Altered sleeping 1  Tired, decreased energy 2  Change in appetite 1  Feeling bad or failure about yourself  0  Trouble concentrating 0  Moving slowly or fidgety/restless 0  Suicidal thoughts 0  PHQ-9 Score 4        07/26/2022    2:02 PM  GAD 7 : Generalized Anxiety Score  Nervous, Anxious, on Edge 0  Control/stop worrying 0  Worry too much - different things 0  Trouble relaxing 0  Restless 0  Easily annoyed or irritable 1  Afraid - awful might happen 0  Total GAD 7 Score 1      Review of Systems:   Pertinent items are noted in HPI Denies abnormal vaginal discharge w/ itching/odor/irritation, headaches, visual changes, shortness of breath, chest pain, abdominal pain, severe nausea/vomiting, or problems with urination or bowel movements unless otherwise stated above. Pertinent History Reviewed:  Reviewed past medical,surgical, social, obstetrical and family history.  Reviewed problem list, medications and allergies. Physical Assessment:   Vitals:   02/18/23 1458  BP: 131/83  Pulse: 88  Weight: 248 lb (112.5 kg)  Body mass index is 40.03 kg/m.        Physical Examination:   General appearance: Well appearing, and in no distress  Mental status: Alert, oriented to person, place, and time  Skin: Warm & dry  Cardiovascular: Normal heart rate noted  Respiratory: Normal respiratory effort, no  distress  Abdomen: Soft, gravid, nontender  Pelvic: Cervical exam deferred         Extremities: Edema: None  Fetal Status: Fetal Heart Rate (bpm): 134 Fundal Height: 37 cm Movement: Present    Chaperone: N/A   No results found for this or any previous visit (from the past 24 hour(s)).  Assessment & Plan:  1) Low-risk pregnancy G1P0 at [redacted]w[redacted]d with an Estimated Date of Delivery: 03/06/23   2) Pre-gravid BMI 36, per MFM recommendations IOL 39-40, discussed slight increase in stillbirth as reason. Pt would rather not have IOL unless absolutely necessary.    Meds: No orders of the defined types were placed in this encounter.  Labs/procedures today: none  Plan:  Continue routine obstetrical care  Next visit: prefers in person    Reviewed: Term labor symptoms and general obstetric precautions including but not limited to vaginal bleeding, contractions, leaking of fluid and fetal movement were reviewed in detail with the patient.  All questions were answered.  Follow-up: Return for As scheduled.  Future Appointments  Date Time Provider [place]  02/25/2023  3:10 PM Roma Schanz, [place] [facility]  03/04/2023  1:30 PM Cresenzo-Dishmon, Joaquim Lai, CNM [facility]    No orders of the defined types were placed in this encounter.  [place], [facility], [place] 02/18/2023 3:30 PM

## 2023-02-18 NOTE — Patient Instructions (Signed)
Christina Schultz, thank you for choosing our office today! We appreciate the opportunity to meet your healthcare needs. You may receive a short survey by mail, e-mail, or through [employer]. If you are happy with your care we would appreciate if you could take just a few minutes to complete the survey questions. We read all of your comments and take your feedback very seriously. Thank you again for choosing our office.  Center for [employer] Team at [place] [facility] ([place], [place] [address]) Entrance C, located off of [address] parking   CLASSES: Go to [email or web address] to register for classes (childbirth, breastfeeding, waterbirth, infant CPR, daddy bootcamp, etc.)  Call the office [phone]) or go to [facility] if: You begin to have strong, frequent contractions Your water breaks.  Sometimes it is a big gush of fluid, sometimes it is just a trickle that keeps getting your panties wet or running down your legs You have vaginal bleeding.  It is normal to have a small amount of spotting if your cervix was checked.  You don't feel your baby moving like normal.  If you don't, get you something to eat and drink and lay down and focus on feeling your baby move.   If your baby is still not moving like normal, you should call the office or go to [facility]  Call the office [phone]) or go to [facility] hospital for these signs of pre-eclampsia: Severe headache that does not go away with Tylenol Visual changes- seeing spots, double, blurred vision Pain under your right breast or upper abdomen that does not go away with Tums or heartburn medicine Nausea and/or vomiting Severe swelling in your hands, feet, and face   [facility] Pediatricians/Family Doctors [facility]): [address] [place], [place]: [address] [place], [place] [facility]): [place], [phone] (call to ask if accepting patients) [facility] Department: [address] [place], [place] [facility]): [address], [facility]: [address] [place], [place] [facility] [place]: [place], [facility]): [phone] [facility]: [address] [place]: [address] [facility]: [phone], [phone]  Home Blood Pressure Monitoring for Patients   Your provider has recommended that you check your blood pressure (BP) at least once a week at home. If you do not have a blood pressure cuff at home, one will be provided for you. Contact your provider if you have not received your monitor within 1 week.   Helpful Tips for Accurate Home Blood Pressure Checks  Don't smoke, exercise, or drink caffeine 30 minutes before checking your BP Use the restroom before checking your BP (a full bladder can raise your pressure) Relax in a comfortable upright chair Feet on the ground Left arm resting comfortably on a flat surface at the level of your heart Legs uncrossed Back supported Sit quietly and don't talk Place the cuff on your bare arm Adjust snuggly, so that only two fingertips  can fit between your skin and the top of the cuff Check 2 readings separated by at least one minute Keep a log of your BP readings For a visual, please reference this diagram: http://ccnc.care/bpdiagram  Provider Name: [facility]     Phone: [phone]  Zone 1: ALL CLEAR  Continue to monitor your symptoms:  BP reading is less than 140 (top number) or less than 90 (bottom number)  No right  upper stomach pain No headaches or seeing spots No feeling nauseated or throwing up No swelling in face and hands  Zone 2: CAUTION Call your doctor's office for any of the following:  BP reading is greater than 140 (top number) or greater than 90 (bottom number)  Stomach pain under your ribs in the middle or right side Headaches or seeing spots Feeling nauseated or throwing up Swelling in face and hands  Zone 3: EMERGENCY  Seek immediate medical care if you have any of the following:  BP reading is greater than160 (top number) or greater than 110 (bottom number) Severe headaches not improving with Tylenol Serious difficulty catching your breath Any worsening symptoms from Zone 2   Braxton Hicks Contractions Contractions of the uterus can occur throughout pregnancy, but they are not always a sign that you are in labor. You may have practice contractions called Braxton Hicks contractions. These false labor contractions are sometimes confused with true labor. What are [place] contractions? Braxton Hicks contractions are tightening movements that occur in the muscles of the uterus before labor. Unlike true labor contractions, these contractions do not result in opening (dilation) and thinning of the cervix. Toward the end of pregnancy (32-34 weeks), Braxton Hicks contractions can happen more often and may become stronger. These contractions are sometimes difficult to tell apart from true labor because they can be very uncomfortable. You should not feel embarrassed if you go to the hospital with false labor. Sometimes, the only way to tell if you are in true labor is for your health care provider to look for changes in the cervix. The health care provider will do a physical exam and may monitor your contractions. If you are not in true labor, the exam should show that your cervix is not dilating and your water has not broken. If there are no other health problems associated with your  pregnancy, it is completely safe for you to be sent home with false labor. You may continue to have Braxton Hicks contractions until you go into true labor. How to tell the difference between true labor and false labor True labor Contractions last 30-70 seconds. Contractions become very regular. Discomfort is usually felt in the top of the uterus, and it spreads to the lower abdomen and low back. Contractions do not go away with walking. Contractions usually become more intense and increase in frequency. The cervix dilates and gets thinner. False labor Contractions are usually shorter and not as strong as true labor contractions. Contractions are usually irregular. Contractions are often felt in the front of the lower abdomen and in the groin. Contractions may go away when you walk around or change positions while lying down. Contractions get weaker and are shorter-lasting as time goes on. The cervix usually does not dilate or become thin. Follow these instructions at home:  Take over-the-counter and prescription medicines only as told by your health care provider. Keep up with your usual exercises and follow other instructions from your health care provider. Eat and drink lightly if you think  you are going into labor. If Braxton Hicks contractions are making you uncomfortable: Change your position from lying down or resting to walking, or change from walking to resting. Sit and rest in a tub of warm water. Drink enough fluid to keep your urine pale yellow. Dehydration may cause these contractions. Do slow and deep breathing several times an hour. Keep all follow-up prenatal visits as told by your health care provider. This is important. Contact a health care provider if: You have a fever. You have continuous pain in your abdomen. Get help right away if: Your contractions become stronger, more regular, and closer together. You have fluid leaking or gushing from your vagina. You pass  blood-tinged mucus (bloody show). You have bleeding from your vagina. You have low back pain that you never had before. You feel your baby's head pushing down and causing pelvic pressure. Your baby is not moving inside you as much as it used to. Summary Contractions that occur before labor are called Braxton Hicks contractions, false labor, or practice contractions. Braxton Hicks contractions are usually shorter, weaker, farther apart, and less regular than true labor contractions. True labor contractions usually become progressively stronger and regular, and they become more frequent. Manage discomfort from [facility] contractions by changing position, resting in a warm bath, drinking plenty of water, or practicing deep breathing. This information is not intended to replace advice given to you by your health care provider. Make sure you discuss any questions you have with your health care provider. Document Revised: 10/18/2017 Document Reviewed: 03/21/2017 Elsevier Patient Education  [place].

## 2023-02-21 DIAGNOSIS — L918 Other hypertrophic disorders of the skin: Secondary | ICD-10-CM | POA: Diagnosis not present

## 2023-02-25 ENCOUNTER — Encounter: Payer: Self-pay | Admitting: Women's Health

## 2023-02-25 ENCOUNTER — Ambulatory Visit (INDEPENDENT_AMBULATORY_CARE_PROVIDER_SITE_OTHER): Payer: Managed Care, Other (non HMO) | Admitting: Women's Health

## 2023-02-25 VITALS — BP 131/83 | HR 92 | Wt 246.0 lb

## 2023-02-25 DIAGNOSIS — Z3A38 38 weeks gestation of pregnancy: Secondary | ICD-10-CM

## 2023-02-25 DIAGNOSIS — Z3403 Encounter for supervision of normal first pregnancy, third trimester: Secondary | ICD-10-CM

## 2023-02-25 NOTE — Progress Notes (Signed)
    LOW-RISK PREGNANCY VISIT Patient name: Christina Schultz MRN [id number]  Date of birth: 08-27-2001 Chief Complaint:   Routine Prenatal Visit  History of Present Illness:   Christina Schultz is a 22 y.o. G1P0 female at [redacted]w[redacted]d with an Estimated Date of Delivery: 03/06/23 being seen today for ongoing management of a low-risk pregnancy.   Today she reports no complaints. Contractions: Not present. Vag. Bleeding: None.  Movement: Present. denies leaking of fluid.     07/26/2022    2:02 PM  Depression screen PHQ 2/9  Decreased Interest 0  Down, Depressed, Hopeless 0  PHQ - 2 Score 0  Altered sleeping 1  Tired, decreased energy 2  Change in appetite 1  Feeling bad or failure about yourself  0  Trouble concentrating 0  Moving slowly or fidgety/restless 0  Suicidal thoughts 0  PHQ-9 Score 4        07/26/2022    2:02 PM  GAD 7 : Generalized Anxiety Score  Nervous, Anxious, on Edge 0  Control/stop worrying 0  Worry too much - different things 0  Trouble relaxing 0  Restless 0  Easily annoyed or irritable 1  Afraid - awful might happen 0  Total GAD 7 Score 1      Review of Systems:   Pertinent items are noted in HPI Denies abnormal vaginal discharge w/ itching/odor/irritation, headaches, visual changes, shortness of breath, chest pain, abdominal pain, severe nausea/vomiting, or problems with urination or bowel movements unless otherwise stated above. Pertinent History Reviewed:  Reviewed past medical,surgical, social, obstetrical and family history.  Reviewed problem list, medications and allergies. Physical Assessment:   Vitals:   02/25/23 1511  BP: 131/83  Pulse: 92  Weight: 246 lb (111.6 kg)  Body mass index is 39.71 kg/m.        Physical Examination:   General appearance: Well appearing, and in no distress  Mental status: Alert, oriented to person, place, and time  Skin: Warm & dry  Cardiovascular: Normal heart rate noted  Respiratory: Normal respiratory effort, no  distress  Abdomen: Soft, gravid, nontender  Pelvic: Cervical exam deferred         Extremities:    Fetal Status: Fetal Heart Rate (bpm): 131 Fundal Height: 38 cm Movement: Present Presentation: Vertex  Chaperone: N/A   No results found for this or any previous visit (from the past 24 hour(s)).  Assessment & Plan:  1) Low-risk pregnancy G1P0 at [redacted]w[redacted]d with an Estimated Date of Delivery: 03/06/23   2) Pre-gravid BMI 36, declines IOL unless absolutely necessary. EFW 32wks 50%   Meds: No orders of the defined types were placed in this encounter.  Labs/procedures today: none  Plan:  Continue routine obstetrical care  Next visit: prefers in person    Reviewed: Term labor symptoms and general obstetric precautions including but not limited to vaginal bleeding, contractions, leaking of fluid and fetal movement were reviewed in detail with the patient.  All questions were answered. Home bp cuff not accurate  Follow-up: Return for As scheduled.  Future Appointments  Date Time Provider Department Center  03/04/2023  1:30 PM Cresenzo-Dishmon, Scarlette [place], CNM [facility]    No orders of the defined types were placed in this encounter.  Cheral Marker CNM, [facility] 02/25/2023 4:03 PM

## 2023-02-25 NOTE — Patient Instructions (Signed)
Cierrah, thank you for choosing our office today! We appreciate the opportunity to meet your healthcare needs. You may receive a short survey by mail, e-mail, or through MyChart. If you are happy with your care we would appreciate if you could take just a few minutes to complete the survey questions. We read all of your comments and take your feedback very seriously. Thank you again for choosing our office.  [facility]  [facility] ([address] [place], [place] [address]) Entrance C, located off of [address] Free 24/7 valet parking   CLASSES: Go to [email or web address] to register for classes (childbirth, breastfeeding, waterbirth, infant CPR, daddy bootcamp, etc.)  Call the office ([phone]) or go to [facility] if: You begin to have strong, frequent contractions Your water breaks.  Sometimes it is a big gush of fluid, sometimes it is just a trickle that keeps getting your panties wet or running down your legs You have vaginal bleeding.  It is normal to have a small amount of spotting if your cervix was checked.  You don't feel your baby moving like normal.  If you don't, get you something to eat and drink and lay down and focus on feeling your baby move.   If your baby is still not moving like normal, you should call the office or go to [facility].  Call the office ([phone]) or go to [facility] for these signs of pre-eclampsia: Severe headache that does not go away with Tylenol Visual changes- seeing spots, double, blurred vision Pain under your right breast or upper abdomen that does not go away with Tums or heartburn medicine Nausea and/or vomiting Severe swelling in your hands, feet, and face   [place] [facility] Doctors [facility] ([facility]): [address], [phone]           [facility]: [address], [phone]                 [facility] ([facility]): [address], [phone] (call to ask if accepting patients) [facility]: [address], [place], [phone]    [place] Pediatricians/Family Doctors [facility] ([facility]): [address], [phone] [facility]: [address], [phone] [facility]: [address], [phone]  [place] [facility]  [facility] ([facility]): [phone] [facility]: [address], [phone]   [place] [facility]: [address], [phone]  [facility]  [facility]: [address], [phone]  Home Blood Pressure Monitoring for Patients   Your provider has recommended that you check your blood pressure (BP) at least once a week at home. If you do not have a blood pressure cuff at home, one will be provided for you. Contact your provider if you have not received your monitor within 1 week.   Helpful Tips for Accurate Home Blood Pressure Checks  Don't smoke, exercise, or drink caffeine 30 minutes before checking your BP Use the restroom before checking your BP (a full bladder can raise your pressure) Relax in a comfortable upright chair Feet on the ground Left arm resting comfortably on a flat surface at the level of your heart Legs uncrossed Back supported Sit quietly and don't talk Place the cuff on your bare arm Adjust snuggly, so that only two fingertips   can fit between your skin and the top of the cuff Check 2 readings separated by at least one minute Keep a log of your BP readings For a visual, please reference this diagram: http://[facility].care/bpdiagram  Provider Name: [facility]     Phone: [phone]  Zone 1: ALL CLEAR  Continue to monitor your symptoms:  BP reading is less than 140 (top number) or less than 90 (bottom number)  No right  upper stomach pain No headaches or seeing spots No feeling nauseated or throwing up No swelling in face and hands  Zone 2: CAUTION Call your doctor's office for any of the following:  BP reading is greater than 140 (top number) or greater than 90 (bottom number)  Stomach pain under your ribs in the middle or right side Headaches or seeing spots Feeling nauseated or throwing up Swelling in face and hands  Zone 3: EMERGENCY  Seek immediate medical care if you have any of the following:  BP reading is greater than160 (top number) or greater than 110 (bottom number) Severe headaches not improving with Tylenol Serious difficulty catching your breath Any worsening symptoms from Zone 2   Braxton Hicks Contractions Contractions of the uterus can occur throughout pregnancy, but they are not always a sign that you are in labor. You may have practice contractions called Braxton Hicks contractions. These false labor contractions are sometimes confused with true labor. What are Braxton Hicks contractions? Braxton Hicks contractions are tightening movements that occur in the muscles of the uterus before labor. Unlike true labor contractions, these contractions do not result in opening (dilation) and thinning of the cervix. Toward the end of pregnancy (32-34 weeks), Braxton Hicks contractions can happen more often and may become stronger. These contractions are sometimes difficult to tell apart from true labor because they can be very uncomfortable. You should not feel embarrassed if you go to the hospital with false labor. Sometimes, the only way to tell if you are in true labor is for your health care provider to look for changes in the cervix. The health care provider will do a physical exam and may monitor your contractions. If you are not in true labor, the exam should show that your cervix is not dilating and your water has not broken. If there are no other health problems associated with your  pregnancy, it is completely safe for you to be sent home with false labor. You may continue to have Braxton Hicks contractions until you go into true labor. How to tell the difference between true labor and false labor True labor Contractions last 30-70 seconds. Contractions become very regular. Discomfort is usually felt in the top of the uterus, and it spreads to the lower abdomen and low back. Contractions do not go away with walking. Contractions usually become more intense and increase in frequency. The cervix dilates and gets thinner. False labor Contractions are usually shorter and not as strong as true labor contractions. Contractions are usually irregular. Contractions are often felt in the front of the lower abdomen and in the groin. Contractions may go away when you walk around or change positions while lying down. Contractions get weaker and are shorter-lasting as time goes on. The cervix usually does not dilate or become thin. Follow these instructions at home:  Take over-the-counter and prescription medicines only as told by your health care provider. Keep up with your usual exercises and follow other instructions from your health care provider. Eat and drink lightly if you think   you are going into labor. If Braxton Hicks contractions are making you uncomfortable: Change your position from lying down or resting to walking, or change from walking to resting. Sit and rest in a tub of warm water. Drink enough fluid to keep your urine pale yellow. Dehydration may cause these contractions. Do slow and deep breathing several times an hour. Keep all follow-up prenatal visits as told by your health care provider. This is important. Contact a health care provider if: You have a fever. You have continuous pain in your abdomen. Get help right away if: Your contractions become stronger, more regular, and closer together. You have fluid leaking or gushing from your vagina. You pass  blood-tinged mucus (bloody show). You have bleeding from your vagina. You have low back pain that you never had before. You feel your baby's head pushing down and causing pelvic pressure. Your baby is not moving inside you as much as it used to. Summary Contractions that occur before labor are called Braxton Hicks contractions, false labor, or practice contractions. Braxton Hicks contractions are usually shorter, weaker, farther apart, and less regular than true labor contractions. True labor contractions usually become progressively stronger and regular, and they become more frequent. Manage discomfort from Braxton Hicks contractions by changing position, resting in a warm bath, drinking plenty of water, or practicing deep breathing. This information is not intended to replace advice given to you by your health care provider. Make sure you discuss any questions you have with your health care provider. Document Revised: 10/18/2017 Document Reviewed: 03/21/2017 Elsevier Patient Education  2020 Elsevier Inc.   

## 2023-03-01 ENCOUNTER — Encounter: Payer: Self-pay | Admitting: Women's Health

## 2023-03-01 ENCOUNTER — Encounter (HOSPITAL_COMMUNITY): Payer: Self-pay | Admitting: Obstetrics & Gynecology

## 2023-03-01 ENCOUNTER — Inpatient Hospital Stay (HOSPITAL_COMMUNITY)
Admission: AD | Admit: 2023-03-01 | Discharge: 2023-03-04 | DRG: 807 | Disposition: A | Discharge status: Home / Self Care | Payer: Managed Care, Other (non HMO) | Attending: Obstetrics & Gynecology | Admitting: Obstetrics & Gynecology

## 2023-03-01 DIAGNOSIS — O26893 Other specified pregnancy related conditions, third trimester: Secondary | ICD-10-CM | POA: Diagnosis present

## 2023-03-01 DIAGNOSIS — Z3403 Encounter for supervision of normal first pregnancy, third trimester: Secondary | ICD-10-CM

## 2023-03-01 DIAGNOSIS — Z3400 Encounter for supervision of normal first pregnancy, unspecified trimester: Secondary | ICD-10-CM

## 2023-03-01 DIAGNOSIS — Z3A39 39 weeks gestation of pregnancy: Secondary | ICD-10-CM

## 2023-03-01 DIAGNOSIS — Z6836 Body mass index (BMI) 36.0-36.9, adult: Secondary | ICD-10-CM

## 2023-03-01 DIAGNOSIS — R030 Elevated blood-pressure reading, without diagnosis of hypertension: Secondary | ICD-10-CM

## 2023-03-01 DIAGNOSIS — O99214 Obesity complicating childbirth: Secondary | ICD-10-CM | POA: Diagnosis present

## 2023-03-01 LAB — CBC
HCT: 36.5 % (ref 36.0–46.0)
Hemoglobin: 12.9 g/dL (ref 12.0–15.0)
MCH: 31.1 pg (ref 26.0–34.0)
MCHC: 35.3 g/dL (ref 30.0–36.0)
MCV: 88 fL (ref 80.0–100.0)
Platelets: 261 10*3/uL (ref 150–400)
RBC: 4.15 MIL/uL (ref 3.87–5.11)
RDW: 14.6 % (ref 11.5–15.5)
WBC: 13.2 10*3/uL — ABNORMAL HIGH (ref 4.0–10.5)
nRBC: 0 % (ref 0.0–0.2)

## 2023-03-01 LAB — POCT FERN TEST: POCT Fern Test: POSITIVE

## 2023-03-01 NOTE — MAU Note (Signed)
Pt says SROM- at 850pm- yellowish [facility]- [facility]  No VE Feels some UC's  Denies HSV GBS- neg

## 2023-03-02 ENCOUNTER — Inpatient Hospital Stay (HOSPITAL_COMMUNITY): Payer: Managed Care, Other (non HMO) | Admitting: Anesthesiology

## 2023-03-02 ENCOUNTER — Encounter (HOSPITAL_COMMUNITY): Payer: Self-pay | Admitting: Obstetrics & Gynecology

## 2023-03-02 ENCOUNTER — Other Ambulatory Visit: Payer: Self-pay

## 2023-03-02 DIAGNOSIS — O26893 Other specified pregnancy related conditions, third trimester: Secondary | ICD-10-CM | POA: Diagnosis not present

## 2023-03-02 DIAGNOSIS — O4202 Full-term premature rupture of membranes, onset of labor within 24 hours of rupture: Secondary | ICD-10-CM | POA: Diagnosis not present

## 2023-03-02 DIAGNOSIS — R030 Elevated blood-pressure reading, without diagnosis of hypertension: Secondary | ICD-10-CM | POA: Diagnosis not present

## 2023-03-02 DIAGNOSIS — O99214 Obesity complicating childbirth: Secondary | ICD-10-CM | POA: Diagnosis not present

## 2023-03-02 DIAGNOSIS — Z3A39 39 weeks gestation of pregnancy: Secondary | ICD-10-CM | POA: Diagnosis not present

## 2023-03-02 LAB — TYPE AND SCREEN
ABO/RH(D): A POS
Antibody Screen: NEGATIVE

## 2023-03-02 LAB — PROTEIN / CREATININE RATIO, URINE
Creatinine, Urine: 240 mg/dL
Protein Creatinine Ratio: 0.15 mg/mg{Cre} (ref 0.00–0.15)
Total Protein, Urine: 37 mg/dL

## 2023-03-02 LAB — COMPREHENSIVE METABOLIC PANEL
ALT: 13 U/L (ref 0–44)
AST: 16 U/L (ref 15–41)
Albumin: 3.2 g/dL — ABNORMAL LOW (ref 3.5–5.0)
Alkaline Phosphatase: 166 U/L — ABNORMAL HIGH (ref 38–126)
Anion gap: 13 (ref 5–15)
BUN: 8 mg/dL (ref 6–20)
CO2: 22 mmol/L (ref 22–32)
Calcium: 9.6 mg/dL (ref 8.9–10.3)
Chloride: 102 mmol/L (ref 98–111)
Creatinine, Ser: 0.65 mg/dL (ref 0.44–1.00)
GFR, Estimated: >60 mL/min (ref >60)
Glucose, Bld: 89 mg/dL (ref 70–99)
Potassium: 4.3 mmol/L (ref 3.5–5.1)
Sodium: 137 mmol/L (ref 135–145)
Total Bilirubin: 0.5 mg/dL (ref 0.3–1.2)
Total Protein: 6.3 g/dL — ABNORMAL LOW (ref 6.5–8.1)

## 2023-03-02 LAB — RPR: RPR Ser Ql: NONREACTIVE

## 2023-03-02 MED ORDER — SENNOSIDES-DOCUSATE SODIUM 8.6-50 MG PO TABS
2.0000 | ORAL_TABLET | Freq: Every day | ORAL | Status: DC
  Administered 2023-03-03 – 2023-03-04 (×2): 2 via ORAL
  Filled 2023-03-02 (×2): qty 2

## 2023-03-02 MED ORDER — ONDANSETRON HCL 4 MG/2ML IJ SOLN: 4.0000 mg | INTRAMUSCULAR | Status: DC | PRN

## 2023-03-02 MED ORDER — OXYTOCIN-SODIUM CHLORIDE 30-0.9 UT/500ML-% IV SOLN
2.5000 [IU]/h | INTRAVENOUS | Status: DC
  Administered 2023-03-02: 2.5 [IU]/h via INTRAVENOUS
  Filled 2023-03-02: qty 500

## 2023-03-02 MED ORDER — OXYTOCIN BOLUS FROM INFUSION
333.0000 mL | Freq: Once | INTRAVENOUS | Status: AC
  Administered 2023-03-02: 333 mL via INTRAVENOUS

## 2023-03-02 MED ORDER — FENTANYL CITRATE (PF) 100 MCG/2ML IJ SOLN
50.0000 ug | INTRAMUSCULAR | Status: DC | PRN
  Administered 2023-03-02: 100 ug via INTRAVENOUS
  Administered 2023-03-02: 50 ug via INTRAVENOUS
  Filled 2023-03-02 (×2): qty 2

## 2023-03-02 MED ORDER — SIMETHICONE 80 MG PO CHEW: 80.0000 mg | CHEWABLE_TABLET | ORAL | Status: DC | PRN

## 2023-03-02 MED ORDER — LACTATED RINGERS IV SOLN: INTRAVENOUS | Status: DC

## 2023-03-02 MED ORDER — ACETAMINOPHEN 325 MG PO TABS
650.0000 mg | ORAL_TABLET | ORAL | Status: DC | PRN
  Administered 2023-03-02 (×2): 650 mg via ORAL
  Filled 2023-03-02 (×3): qty 2

## 2023-03-02 MED ORDER — LIDOCAINE HCL (PF) 1 % IJ SOLN
INTRAMUSCULAR | Status: DC | PRN
  Administered 2023-03-02: 2 mL via EPIDURAL
  Administered 2023-03-02: 10 mL via EPIDURAL

## 2023-03-02 MED ORDER — BENZOCAINE-MENTHOL 20-0.5 % EX AERO
1.0000 | INHALATION_SPRAY | CUTANEOUS | Status: DC | PRN
  Administered 2023-03-02: 1 via TOPICAL
  Filled 2023-03-02: qty 56

## 2023-03-02 MED ORDER — TRANEXAMIC ACID-NACL 1000-0.7 MG/100ML-% IV SOLN
1000.0000 mg | INTRAVENOUS | Status: AC
  Administered 2023-03-02: 1000 mg via INTRAVENOUS

## 2023-03-02 MED ORDER — SOD CITRATE-CITRIC ACID 500-334 MG/5ML PO SOLN: 30.0000 mL | ORAL | Status: DC | PRN

## 2023-03-02 MED ORDER — LACTATED RINGERS IV SOLN: 500.0000 mL | Freq: Once | INTRAVENOUS | Status: DC

## 2023-03-02 MED ORDER — PRENATAL MULTIVITAMIN CH
1.0000 | ORAL_TABLET | Freq: Every day | ORAL | Status: DC
  Administered 2023-03-02 – 2023-03-04 (×3): 1 via ORAL
  Filled 2023-03-02 (×3): qty 1

## 2023-03-02 MED ORDER — LACTATED RINGERS IV SOLN: 500.0000 mL | INTRAVENOUS | Status: DC | PRN

## 2023-03-02 MED ORDER — ACETAMINOPHEN 325 MG PO TABS: 650.0000 mg | ORAL_TABLET | ORAL | Status: DC | PRN

## 2023-03-02 MED ORDER — ONDANSETRON HCL 4 MG PO TABS: 4.0000 mg | ORAL_TABLET | ORAL | Status: DC | PRN

## 2023-03-02 MED ORDER — TETANUS-DIPHTH-ACELL PERTUSSIS 5-2.5-18.5 LF-MCG/0.5 IM SUSY: 0.5000 mL | PREFILLED_SYRINGE | Freq: Once | INTRAMUSCULAR | Status: DC

## 2023-03-02 MED ORDER — EPHEDRINE 5 MG/ML INJ: 10.0000 mg | INTRAVENOUS | Status: DC | PRN

## 2023-03-02 MED ORDER — ONDANSETRON HCL 4 MG/2ML IJ SOLN: 4.0000 mg | Freq: Four times a day (QID) | INTRAMUSCULAR | Status: DC | PRN

## 2023-03-02 MED ORDER — DIPHENHYDRAMINE HCL 25 MG PO CAPS: 25.0000 mg | ORAL_CAPSULE | Freq: Four times a day (QID) | ORAL | Status: DC | PRN

## 2023-03-02 MED ORDER — ZOLPIDEM TARTRATE 5 MG PO TABS: 5.0000 mg | ORAL_TABLET | Freq: Every evening | ORAL | Status: DC | PRN

## 2023-03-02 MED ORDER — PHENYLEPHRINE 80 MCG/ML (10ML) SYRINGE FOR IV PUSH (FOR BLOOD PRESSURE SUPPORT): 80.0000 ug | PREFILLED_SYRINGE | INTRAVENOUS | Status: DC | PRN

## 2023-03-02 MED ORDER — WITCH HAZEL-GLYCERIN EX PADS: 1.0000 | MEDICATED_PAD | CUTANEOUS | Status: DC | PRN

## 2023-03-02 MED ORDER — LIDOCAINE HCL (PF) 1 % IJ SOLN
30.0000 mL | INTRAMUSCULAR | Status: AC | PRN
  Administered 2023-03-02: 30 mL via SUBCUTANEOUS
  Filled 2023-03-02: qty 30

## 2023-03-02 MED ORDER — DIBUCAINE (PERIANAL) 1 % EX OINT: 1.0000 | TOPICAL_OINTMENT | CUTANEOUS | Status: DC | PRN

## 2023-03-02 MED ORDER — FENTANYL-BUPIVACAINE-NACL 0.5-0.125-0.9 MG/250ML-% EP SOLN
EPIDURAL | Status: DC | PRN
  Administered 2023-03-02: 12 mL/h via EPIDURAL

## 2023-03-02 MED ORDER — COCONUT OIL OIL: 1.0000 | TOPICAL_OIL | Status: DC | PRN

## 2023-03-02 MED ORDER — DIPHENHYDRAMINE HCL 50 MG/ML IJ SOLN: 12.5000 mg | INTRAMUSCULAR | Status: DC | PRN

## 2023-03-02 MED ORDER — IBUPROFEN 600 MG PO TABS
600.0000 mg | ORAL_TABLET | Freq: Four times a day (QID) | ORAL | Status: DC
  Administered 2023-03-02 – 2023-03-04 (×9): 600 mg via ORAL
  Filled 2023-03-02 (×9): qty 1

## 2023-03-02 MED ORDER — FENTANYL-BUPIVACAINE-NACL 0.5-0.125-0.9 MG/250ML-% EP SOLN
12.0000 mL/h | EPIDURAL | Status: DC | PRN
  Filled 2023-03-02: qty 250

## 2023-03-02 MED ORDER — TRANEXAMIC ACID-NACL 1000-0.7 MG/100ML-% IV SOLN
INTRAVENOUS | Status: AC
  Filled 2023-03-02: qty 100

## 2023-03-02 NOTE — Discharge Summary (Shared)
Postpartum Discharge Summary  Date of Service updated***     Patient Name: Christina Schultz DOB: 2001-04-11 MRN: [id number]  Date of admission: 03/01/2023 Delivery date:03/02/2023  Delivering provider: Lavonda Jumbo  Date of discharge: 03/02/2023  Admitting diagnosis: Indication for care in labor and delivery, antepartum [O75.9] Intrauterine pregnancy: [redacted]w[redacted]d     Secondary diagnosis:  Principal Problem:   Vaginal delivery Active Problems:   Supervision of normal first pregnancy  Additional problems: ***    Discharge diagnosis: {DX.:23714}                                              Post partum procedures:{Postpartum procedures:23558} Augmentation: N/A Complications: None  Hospital course: Onset of Labor With Vaginal Delivery      22 y.o. yo G1P1001 at [redacted]w[redacted]d was admitted in Active Labor on 03/01/2023. Labor course was complicated by precipitous delivery.   Membrane Rupture Time/Date: 8:50 PM ,03/01/2023   Delivery Method:Vaginal, Spontaneous  Episiotomy: None  Lacerations:  2nd degree  Patient had a postpartum course complicated by ***.  She is ambulating, tolerating a regular diet, passing flatus, and urinating well. Patient is discharged home in stable condition on 03/02/23.  Newborn Data: Birth date:03/02/2023  Birth time:5:22 AM  Gender:Female  Living status:Living  Apgars:8 ,9  Weight:3370 g   Magnesium Sulfate received: {Mag received:30440022} BMZ received: {BMZ received:30440023} Rhophylac:{Rhophylac received:30440032} TKK:{OEC:95072257} T-DaP:{Tdap:23962} Flu: {DYN:18335} Transfusion:{Transfusion received:30440034}  Physical exam  Vitals:   03/02/23 0906 03/02/23 1020 03/02/23 1433 03/02/23 1846  BP: 122/74 111/72 128/79 137/80  Pulse: (!) 104 (!) 104 99 (!) 109  Resp: 18 16 18 18   Temp: 98.7 F (37.1 C) 98.8 F (37.1 C) 98.5 F (36.9 C) 98.7 F (37.1 C)  TempSrc: Oral Oral Oral Oral  SpO2:  96% 100% 96%  Weight:      Height:       General:  {Exam; general:21111117} Lochia: {Desc; appropriate/inappropriate:30686::"appropriate"} Uterine Fundus: {Desc; firm/soft:30687} Incision: {Exam; incision:21111123} DVT Evaluation: {Exam; OIP:1898421} Labs: Lab Results  Component Value Date   WBC 13.2 (H) 03/01/2023   HGB 12.9 03/01/2023   HCT 36.5 03/01/2023   MCV 88.0 03/01/2023   PLT 261 03/01/2023      Latest Ref Rng & Units 03/01/2023   10:43 PM  CMP  Glucose 70 - 99 mg/dL 89   BUN 6 - 20 mg/dL 8   Creatinine 0.31 - 2.81 mg/dL 1.88   Sodium 677 - 373 mmol/L 137   Potassium 3.5 - 5.1 mmol/L 4.3   Chloride 98 - 111 mmol/L 102   CO2 22 - 32 mmol/L 22   Calcium 8.9 - 10.3 mg/dL 9.6   Total Protein 6.5 - 8.1 g/dL 6.3   Total Bilirubin 0.3 - 1.2 mg/dL 0.5   Alkaline Phos 38 - 126 U/L 166   AST 15 - 41 U/L 16   ALT 0 - 44 U/L 13    Edinburgh Score:    03/02/2023   10:20 AM  Edinburgh Postnatal Depression Scale Screening Tool  I have been able to laugh and see the funny side of things. 0  I have looked forward with enjoyment to things. 0  I have blamed myself unnecessarily when things went wrong. 1  I have been anxious or worried for no good reason. 0  I have felt scared or panicky for no good reason. 0  Things have been getting on top of me. 0  I have been so unhappy that I have had difficulty sleeping. 1  I have felt sad or miserable. 0  I have been so unhappy that I have been crying. 0  The thought of harming myself has occurred to me. 0  Edinburgh Postnatal Depression Scale Total 2     After visit meds:  Allergies as of 03/02/2023   No Known Allergies   Med Rec must be completed prior to using this [facility]***        Discharge home in stable condition Infant Feeding: {Baby feeding:23562} Infant Disposition:{CHL IP OB HOME WITH ZOXWRU:04540} Discharge instruction: per After Visit Summary and Postpartum booklet. Activity: Advance as tolerated. Pelvic rest for 6 weeks.  Diet: {OB JWJX:91478295} Future  Appointments: Future Appointments  Date Time Provider Department Center  03/04/2023  2:10 PM Cresenzo-Dishmon, Scarlette Calico, CNM [facility]   Follow up Visit:  Message sent to [facility] by Autry-Lott on 03/02/2023  Please schedule this patient for a In person postpartum visit in 6 weeks with the following provider: Any provider. Additional Postpartum F/U: n/a   Low risk pregnancy complicated by:  precipitous delivery Delivery mode:  Vaginal, Spontaneous  Anticipated Birth Control:  POPs   03/02/2023 Christina Schultz Autry-Lott, DO

## 2023-03-02 NOTE — Anesthesia Postprocedure Evaluation (Signed)
Anesthesia Post Note  Patient: Christina Schultz  Procedure(s) Performed: AN AD HOC LABOR EPIDURAL     Patient location during evaluation: Mother Baby Anesthesia Type: Epidural Level of consciousness: awake and alert and oriented Pain management: satisfactory to patient Vital Signs Assessment: post-procedure vital signs reviewed and stable Respiratory status: respiratory function stable Cardiovascular status: stable Postop Assessment: no headache, no backache, epidural receding, patient able to bend at knees, no signs of nausea or vomiting, adequate PO intake and able to ambulate Anesthetic complications: no   No notable events documented.  Last Vitals:  Vitals:   03/02/23 1020 03/02/23 1433  BP: 111/72 128/79  Pulse: (!) 104 99  Resp: 16 18  Temp: 37.1 C 36.9 C  SpO2: 96% 100%    Last Pain:  Vitals:   03/02/23 1433  TempSrc: Oral  PainSc:    Pain Goal:                   Omarrion Carmer

## 2023-03-02 NOTE — H&P (Cosign Needed Addendum)
Christina Schultz is a 22 y.o. female presenting for SROM at 65. Patient receives care at [facility] and was supervised for a low-risk pregnancy. Pregnancy and medical history significant for problems as listed below. She is GBS negative and expresses a desire for epidurla for pain management.  She is anticipating a female infant and requests oral contraception for PP birth control method.    OB History     Gravida  1   Para      Term      Preterm      AB      Living         SAB      IAB      Ectopic      Multiple      Live Births             Past Medical History:  Diagnosis Date   Medical history non-contributory    Past Surgical History:  Procedure Laterality Date   WISDOM TOOTH EXTRACTION     Family History: family history includes Cancer in her maternal grandfather; Diabetes in her mother and paternal grandmother; Heart attack in her mother and paternal grandmother; Hypertension in her father and mother; Stroke in her maternal grandmother. Social History:  reports that she has never smoked. She has never used smokeless tobacco. She reports that she does not currently use alcohol. She reports that she does not use drugs.     Maternal Diabetes: No Genetic Screening: Normal Maternal Ultrasounds/Referrals: Isolated EIF (echogenic intracardiac focus) Fetal Ultrasounds or other Referrals:  None Maternal Substance Abuse:  No Significant Maternal Medications:  None Significant Maternal Lab Results:  Group B Strep negative Number of Prenatal Visits:greater than 3 verified prenatal visits Other Comments:  None  Review of Systems  Gastrointestinal:  Positive for abdominal pain (Cramping).  Genitourinary:  Negative for difficulty urinating, dysuria, vaginal bleeding and vaginal discharge.   Maternal Medical History:  Reason for admission: Rupture of membranes and contractions.   Contractions: Onset was 6-12 hours ago.   Fetal activity: Perceived fetal activity is  normal.   Last perceived fetal movement was within the past hour.   Prenatal complications: no prenatal complications Prenatal Complications - Diabetes: none.   Dilation: 2 Effacement (%): 80 Station: -2 Exam by:: [employer], RN Blood pressure 100/88, pulse 76, temperature 97.7 F (36.5 C), temperature source Oral, resp. rate 19, height  (1.676 m), weight 111.4 kg, last menstrual period 05/09/2022, SpO2 97 %. Maternal Exam:  Uterine Assessment: Contraction strength is moderate.  Contraction frequency is regular.  Abdomen: Patient reports no abdominal tenderness. Introitus: Ferning test: positive.  Amniotic fluid character: meconium stained. Cervix: Cervix evaluated by digital exam.     Fetal Exam Fetal Monitor Review: Mode: ultrasound.   Baseline rate: 135.  Variability: moderate (6-25 bpm).   Pattern: accelerations present and no decelerations.   Fetal State Assessment: Category I - tracings are normal.   Physical Exam Constitutional:      General: She is in acute distress (With contractions).     Appearance: Normal appearance.  HENT:     Head: Normocephalic and atraumatic.  Eyes:     Conjunctiva/sclera: Conjunctivae normal.  Cardiovascular:     Rate and Rhythm: Normal rate.  Pulmonary:     Effort: Pulmonary effort is normal. No respiratory distress.  Abdominal:     Palpations: Abdomen is soft.     Tenderness: There is no abdominal tenderness.     Comments: Gravid, Appears  LGA  Musculoskeletal:     Cervical back: Normal range of motion.     Right lower leg: Edema present.     Left lower leg: Edema (Nonpitting) present.  Skin:    General: Skin is warm and dry.  Neurological:     Mental Status: She is alert and oriented to person, place, and time.  Psychiatric:        Mood and Affect: Mood normal.        Behavior: Behavior normal.     Prenatal labs: ABO, Rh: --/--/A POS (04/12 2243) Antibody: NEG (04/12 2243) Rubella: 5.82 (10/11 1100) RPR: Non  Reactive (01/26 0826)  HBsAg: Negative (10/11 1100)  HIV: Non Reactive (01/26 0826)  GBS: Negative/-- (03/22 1345)   Assessment/Plan: 22 year old, G1P0 SIUp at 39.3 weeks Cat I FT SROM GBS Negative Obesity in Pregnancy  Admit to [employer]  Routine Labor and Delivery Orders per Protocol In room to complete assessment and discuss POC: Patient to have epidural once on L&D Unit Anticipating female infant Plans to pump Desires oral contraception Augment as appropriate. IV pain medication until epidural can be placed.   Elevated BP in MAU. PreE Labs collected.   Cherre Robins 03/02/2023, 3:39 AM

## 2023-03-02 NOTE — Progress Notes (Signed)
Labor Progress Note [name] is a 22 y.o. G1P0 at [redacted]w[redacted]d presented for SROM.   S:No acute concerns.   O:  BP (!) 138/99   Pulse (!) 135   Temp 97.7 F (36.5 C) (Oral)   Resp 19   Ht 5\' 6"  (1.676 m)   Wt 111.4 kg   LMP 05/09/2022   SpO2 97%   BMI 39.62 kg/m  EFM: 135bpm/moderate/+accels, no decels  CVE: Dilation: 8 Effacement (%): 100 Station: 0 Presentation: Vertex Exam by:: [employer]ura [employer], RN   A&P: 22 y.o. G1P0 [redacted]w[redacted]d here for SROM.  #Labor: Progressing well. AROM of forebag with thick meconium.  #Pain: Epidural #FWB: Cat I #GBS negative   Stephaniemarie Stoffel Autry-Lott, DO 4:57 AM

## 2023-03-02 NOTE — Anesthesia Procedure Notes (Signed)
Epidural Patient location during procedure: OB Start time: 03/02/2023 3:42 AM End time: 03/02/2023 3:52 AM  Staffing Anesthesiologist: Lannie Fields, DO Performed: anesthesiologist   Preanesthetic Checklist Completed: patient identified, IV checked, risks and benefits discussed, monitors and equipment checked, pre-op evaluation and timeout performed  Epidural Patient position: sitting Prep: DuraPrep and site prepped and draped Patient monitoring: continuous pulse ox, blood pressure, heart rate and cardiac monitor Approach: midline Location: L3-L4 Injection technique: LOR air  Needle:  Needle type: Tuohy  Needle gauge: 17 G Needle length: 9 cm Needle insertion depth: 8 cm Catheter type: closed end flexible Catheter size: 19 Gauge Catheter at skin depth: 15 cm Test dose: negative  Assessment Sensory level: T8 Events: blood not aspirated, no cerebrospinal fluid, injection not painful, no injection resistance, no paresthesia and negative IV test  Additional Notes Patient identified. Risks/Benefits/Options discussed with patient including but not limited to bleeding, infection, nerve damage, paralysis, failed block, incomplete pain control, headache, blood pressure changes, nausea, vomiting, reactions to medication both or allergic, itching and postpartum back pain. Confirmed with bedside nurse the patient's most recent platelet count. Confirmed with patient that they are not currently taking any anticoagulation, have any bleeding history or any family history of bleeding disorders. Patient expressed understanding and wished to proceed. All questions were answered. Sterile technique was used throughout the entire procedure. Please see nursing notes for vital signs. Test dose was given through epidural catheter and negative prior to continuing to dose epidural or start infusion. Warning signs of high block given to the patient including shortness of breath, tingling/numbness in  hands, complete motor block, or any concerning symptoms with instructions to call for help. Patient was given instructions on fall risk and not to get out of bed. All questions and concerns addressed with instructions to call with any issues or inadequate analgesia.  Reason for block:procedure for pain

## 2023-03-02 NOTE — Anesthesia Preprocedure Evaluation (Addendum)
Anesthesia Evaluation  Patient identified by MRN, date of birth, ID band Patient awake    Reviewed: Allergy & Precautions, Patient's Chart, lab work & pertinent test results  Airway Mallampati: III  TM Distance: >3 FB Neck ROM: Full    Dental no notable dental hx.    Pulmonary neg pulmonary ROS   Pulmonary exam normal breath sounds clear to auscultation       Cardiovascular negative cardio ROS Normal cardiovascular exam Rhythm:Regular Rate:Normal     Neuro/Psych negative neurological ROS  negative psych ROS   GI/Hepatic negative GI ROS, Neg liver ROS,,,  Endo/Other    Morbid obesityBMI 40  Renal/GU negative Renal ROS  negative genitourinary   Musculoskeletal negative musculoskeletal ROS (+)    Abdominal  (+) + obese  Peds negative pediatric ROS (+)  Hematology negative hematology ROS (+) Hb 12.9, plt 261   Anesthesia Other Findings   Reproductive/Obstetrics (+) Pregnancy                             Anesthesia Physical Anesthesia Plan  ASA: 3  Anesthesia Plan: Epidural   Post-op Pain Management:    Induction:   PONV Risk Score and Plan: 2  Airway Management Planned: Natural Airway  Additional Equipment: None  Intra-op Plan:   Post-operative Plan:   Informed Consent: I have reviewed the patients History and Physical, chart, labs and discussed the procedure including the risks, benefits and alternatives for the proposed anesthesia with the patient or authorized representative who has indicated his/her understanding and acceptance.       Plan Discussed with:   Anesthesia Plan Comments:        Anesthesia Quick Evaluation

## 2023-03-03 MED ORDER — NIFEDIPINE ER OSMOTIC RELEASE 30 MG PO TB24: 30.0000 mg | ORAL_TABLET | Freq: Every day | ORAL | 0 refills | Status: DC

## 2023-03-03 MED ORDER — FUROSEMIDE 20 MG PO TABS: 20.0000 mg | ORAL_TABLET | Freq: Every day | ORAL | 0 refills | Status: DC

## 2023-03-03 MED ORDER — ACETAMINOPHEN 325 MG PO TABS: 650.0000 mg | ORAL_TABLET | ORAL | 0 refills | Status: DC | PRN

## 2023-03-03 MED ORDER — IBUPROFEN 600 MG PO TABS: 600.0000 mg | ORAL_TABLET | Freq: Four times a day (QID) | ORAL | 0 refills | Status: DC

## 2023-03-03 MED ORDER — FUROSEMIDE 20 MG PO TABS
20.0000 mg | ORAL_TABLET | Freq: Every day | ORAL | Status: DC
  Administered 2023-03-03 – 2023-03-04 (×2): 20 mg via ORAL
  Filled 2023-03-03 (×2): qty 1

## 2023-03-03 NOTE — Progress Notes (Signed)
POSTPARTUM PROGRESS NOTE  Post Partum Day 1  Subjective:  Christina Schultz is a 22 y.o. G1P1001 s/p VD at [redacted]w[redacted]d.  She reports she is doing well. No acute events overnight. She denies any problems with ambulating, voiding or po intake. Denies nausea or vomiting.  Pain is well controlled.  Lochia is appropriate.  Objective: Blood pressure 117/84, pulse 93, temperature 98 F (36.7 C), temperature source Oral, resp. rate 20, height 5\' 6"  (1.676 m), weight 111.4 kg, last menstrual period 05/09/2022, SpO2 99 %, unknown if currently breastfeeding.  Physical Exam:  General: alert, cooperative and no distress Chest: no respiratory distress Heart:regular rate, distal pulses intact Abdomen: soft, nontender,  Uterine Fundus: firm, appropriately tender DVT Evaluation: No calf swelling or tenderness Extremities: No LE edema Skin: warm, dry  Recent Labs    03/01/23 2243  HGB 12.9  HCT 36.5    Assessment/Plan: Christina Schultz is a 22 y.o. G1P1001 s/p VD at [redacted]w[redacted]d   PPD#1 - Doing well  Routine postpartum care Elevated BP readings -Start procardia and lasix  Contraception: OCPs Feeding: Bottle Dispo: Plan for discharge 4/15.   LOS: 1 day   Lavonda Jumbo, DO OB Fellow, [facility], Center for [facility] 03/03/2023, 7:20 AM

## 2023-03-04 ENCOUNTER — Other Ambulatory Visit (HOSPITAL_COMMUNITY): Payer: Self-pay

## 2023-03-04 ENCOUNTER — Encounter: Payer: Managed Care, Other (non HMO) | Admitting: Advanced Practice Midwife

## 2023-03-04 MED ORDER — IBUPROFEN 600 MG PO TABS
600.0000 mg | ORAL_TABLET | Freq: Four times a day (QID) | ORAL | 0 refills | Status: AC
  Filled 2023-03-04: qty 30, 8d supply, fill #0

## 2023-03-04 MED ORDER — NIFEDIPINE ER OSMOTIC RELEASE 30 MG PO TB24
30.0000 mg | ORAL_TABLET | Freq: Every day | ORAL | Status: DC
  Administered 2023-03-04: 30 mg via ORAL
  Filled 2023-03-04: qty 1

## 2023-03-04 MED ORDER — NIFEDIPINE ER 30 MG PO TB24
30.0000 mg | ORAL_TABLET | Freq: Every day | ORAL | 0 refills | Status: AC
  Filled 2023-03-04: qty 30, 30d supply, fill #0

## 2023-03-04 MED ORDER — FUROSEMIDE 20 MG PO TABS
20.0000 mg | ORAL_TABLET | Freq: Every day | ORAL | 0 refills | Status: DC
  Filled 2023-03-04: qty 5, 5d supply, fill #0

## 2023-03-04 MED ORDER — ACETAMINOPHEN 325 MG PO TABS
650.0000 mg | ORAL_TABLET | ORAL | 0 refills | Status: AC | PRN
  Filled 2023-03-04: qty 30, 3d supply, fill #0

## 2023-03-04 MED ORDER — NIFEDIPINE ER OSMOTIC RELEASE 30 MG PO TB24
30.0000 mg | ORAL_TABLET | Freq: Every day | ORAL | 0 refills | Status: DC
  Filled 2023-03-04: qty 30, 30d supply, fill #0

## 2023-03-04 NOTE — Discharge Summary (Signed)
Postpartum Discharge Summary     Patient Name: Christina Schultz DOB: 06-01-2001 MRN: [id number]  Date of admission: 03/01/2023 Delivery date:03/02/2023  Delivering provider: Lavonda Jumbo  Date of discharge: 03/04/2023  Admitting diagnosis: Indication for care in labor and delivery, antepartum [O75.9] Intrauterine pregnancy: [redacted]w[redacted]d     Secondary diagnosis:  Principal Problem:   Vaginal delivery Active Problems:   Supervision of normal first pregnancy  Additional problems: n/a    Discharge diagnosis: Term Pregnancy Delivered                                              Post partum procedures: n/a Augmentation: N/A Complications: None  Hospital course: Onset of Labor With Vaginal Delivery      22 y.o. yo G1P1001 at [redacted]w[redacted]d was admitted in Active Labor on 03/01/2023. Labor course was complicated by precipitous delivery.   Membrane Rupture Time/Date: 8:50 PM ,03/01/2023   Delivery Method:Vaginal, Spontaneous  Episiotomy: None  Lacerations:  2nd degree  Patient had a postpartum course complicated by elevated blood pressure readings. She was started on lasix x7 days and procardia 30 mg daily.  She is ambulating, tolerating a regular diet, passing flatus, and urinating well. Patient is discharged home in stable condition on 03/04/23.  Newborn Data: Birth date:03/02/2023  Birth time:5:22 AM  Gender:Female  Living status:Living  Apgars:8 ,9  Weight:3370 g   Magnesium Sulfate received: No BMZ received: No Rhophylac:No MMR:No T-DaP:Given prenatally Flu: No Transfusion:No  Physical exam  Vitals:   03/03/23 0515 03/03/23 0603 03/03/23 1400 03/03/23 2054  BP: 125/86 117/84 129/83 130/81  Pulse: 93 93 90 98  Resp: Temp: 98 F (36.7 C)  98.2 F (36.8 C) (!) 97 F (36.1 C)  TempSrc: Oral  Oral Axillary  SpO2: 99%   98%  Weight:      Height:       General: alert, cooperative, and no distress Lochia: appropriate Uterine Fundus: firm Incision: N/A DVT  Evaluation: No evidence of DVT seen on physical exam. Labs: Lab Results  Component Value Date   WBC 13.2 (H) 03/01/2023   HGB 12.9 03/01/2023   HCT 36.5 03/01/2023   MCV 88.0 03/01/2023   PLT 261 03/01/2023      Latest Ref Rng & Units 03/01/2023   10:43 PM  CMP  Glucose 70 - 99 mg/dL 89   BUN 6 - 20 mg/dL 8   Creatinine 4.09 - 8.11 mg/dL 9.14   Sodium 782 - 956 mmol/L 137   Potassium 3.5 - 5.1 mmol/L 4.3   Chloride 98 - 111 mmol/L 102   CO2 22 - 32 mmol/L 22   Calcium 8.9 - 10.3 mg/dL 9.6   Total Protein 6.5 - 8.1 g/dL 6.3   Total Bilirubin 0.3 - 1.2 mg/dL 0.5   Alkaline Phos 38 - 126 U/L 166   AST 15 - 41 U/L 16   ALT 0 - 44 U/L 13    Edinburgh Score:    03/02/2023   10:20 AM  Edinburgh Postnatal Depression Scale Screening Tool  I have been able to laugh and see the funny side of things. 0  I have looked forward with enjoyment to things. 0  I have blamed myself unnecessarily when things went wrong. 1  I have been anxious or worried for no good reason. 0  I have  felt scared or panicky for no good reason. 0  Things have been getting on top of me. 0  I have been so unhappy that I have had difficulty sleeping. 1  I have felt sad or miserable. 0  I have been so unhappy that I have been crying. 0  The thought of harming myself has occurred to me. 0  Edinburgh Postnatal Depression Scale Total 2     After visit meds:  Allergies as of 03/04/2023   No Known Allergies      Medication List     STOP taking these medications    aspirin 81 MG chewable tablet       TAKE these medications    acetaminophen 325 MG tablet Commonly known as: Tylenol Take 2 tablets (650 mg total) by mouth every 4 (four) hours as needed (for pain scale < 4).   furosemide 20 MG tablet Commonly known as: LASIX Take 1 tablet (20 mg total) by mouth daily for 5 days.   ibuprofen 600 MG tablet Commonly known as: ADVIL Take 1 tablet (600 mg total) by mouth every 6 (six) hours.    NIFEdipine 30 MG 24 hr tablet Commonly known as: PROCARDIA-XL/NIFEDICAL-XL Take 1 tablet (30 mg total) by mouth daily.   PRENATAL VITAMIN PO Take by mouth.         Discharge home in stable condition Infant Feeding: Bottle Infant Disposition:home with mother Discharge instruction: per After Visit Summary and Postpartum booklet. Activity: Advance as tolerated. Pelvic rest for 6 weeks.  Diet: routine diet Future Appointments: Future Appointments  Date Time Provider Department Center  03/04/2023  2:10 PM Cresenzo-Dishmon, Scarlette Calico, CNM [facility]   Follow up Visit:  Message sent to [facility] by Autry-Lott on 03/04/2023  Please schedule this patient for a In person postpartum visit in 6 weeks with the following provider: Any provider. Additional Postpartum F/U:BP check 1 week  Low risk pregnancy complicated by:  n/a Delivery mode:  Vaginal, Spontaneous  Anticipated Birth Control:  OCPs   03/04/2023 Lorali Khamis Autry-Lott, DO

## 2023-03-06 ENCOUNTER — Other Ambulatory Visit: Payer: Self-pay | Admitting: Family Medicine

## 2023-03-08 ENCOUNTER — Encounter: Payer: Managed Care, Other (non HMO) | Admitting: *Deleted

## 2023-03-08 ENCOUNTER — Encounter: Payer: Self-pay | Admitting: *Deleted

## 2023-03-12 NOTE — Progress Notes (Signed)
This encounter was created in error - please disregard.

## 2023-03-14 ENCOUNTER — Telehealth (HOSPITAL_COMMUNITY): Payer: Self-pay | Admitting: *Deleted

## 2023-03-14 NOTE — Telephone Encounter (Signed)
Mom reports feeling good. No concerns about herself at this time. EPDS=2 [facility] score=2) Mom reports baby is doing well. Feeding, peeing, and pooping without difficulty. Safe sleep reviewed. Mom reports no concerns about baby at present.  Duffy Rhody, RN 03-14-2023 at 12:28pm

## 2023-04-02 ENCOUNTER — Ambulatory Visit: Payer: Managed Care, Other (non HMO) | Admitting: Women's Health

## 2023-04-11 ENCOUNTER — Ambulatory Visit: Payer: Managed Care, Other (non HMO) | Admitting: Advanced Practice Midwife

## 2023-04-11 ENCOUNTER — Encounter: Payer: Self-pay | Admitting: Advanced Practice Midwife

## 2023-04-11 NOTE — Progress Notes (Signed)
Post Partum Visit Note   Chief Complaint:   Postpartum Care  History of Present Illness:   Christina Schultz is a 22 y.o. G66P1001  female being seen today for a postpartum visit. She is 6 weeks postpartum following a spontaneous vaginal delivery at 39.3 gestational weeks. IOL: No,. Anesthesia: epidural.  Laceration: 2nd degree.  Complications: none. Inpatient contraception: no.  BP up a little after delivery, sent home on BP meds--BP bottomed out fairly quickly after going home, PCP said go ahead and stop meds.  Pregnancy uncomplicated. Tobacco use: no. Substance use disorder: no. Last pap smear: 07/26/22 and results were NILM w/ HRHPV not done. Next pap smear due: 2026 Patient's last menstrual period was 03/18/2023.  Postpartum course has been uncomplicated. Bleeding no bleeding. Bowel function is normal. Bladder function is normal. Urinary incontinence? No, fecal incontinence? No Patient is not sexually active. Last sexual activity: prior.    Upstream - 04/11/23 1350       Pregnancy Intention Screening   Does the patient want to become pregnant in the next year? No    Does the patient's partner want to become pregnant in the next year? No    Would the patient like to discuss contraceptive options today? Yes      Contraception Wrap Up   Current Method Oral Contraceptive    End Method Oral Contraceptive    Contraception Counseling Provided Yes            The pregnancy intention screening data noted above was reviewed. Potential methods of contraception were discussed. The patient elected to proceed with Oral Contraceptive.  Edinburgh Postpartum Depression Screening: Negative  Edinburgh Postnatal Depression Scale - 04/11/23 1351       Edinburgh Postnatal Depression Scale:  In the Past 7 Days   I have been able to laugh and see the funny side of things. 0    I have looked forward with enjoyment to things. 0    I have blamed myself unnecessarily when things went wrong. 1    I have  been anxious or worried for no good reason. 1    I have felt scared or panicky for no good reason. 1    Things have been getting on top of me. 1    I have been so unhappy that I have had difficulty sleeping. 1    I have felt sad or miserable. 1    I have been so unhappy that I have been crying. 1    The thought of harming myself has occurred to me. 0    Edinburgh Postnatal Depression Scale Total 7            Baby's course has been uncomplicated. Baby is feeding by bottle. Infant has a pediatrician/family doctor? Yes.  Childcare strategy if returning to work/school: yes.  Pt has material needs met for her and baby: Yes.   Review of Systems:   Pertinent items are noted in HPI Denies Abnormal vaginal discharge w/ itching/odor/irritation, headaches, visual changes, shortness of breath, chest pain, abdominal pain, severe nausea/vomiting, or problems with urination or bowel movements. Pertinent History Reviewed:  Reviewed past medical,surgical, obstetrical and family history.  Reviewed problem list, medications and allergies. OB History  Gravida Para Term Preterm AB Living  1 1 1     1   SAB IAB Ectopic Multiple Live Births        0 1    # Outcome Date GA Lbr Len/2nd Weight Sex Delivery Anes PTL Lv  1 Term 03/02/23 [redacted]w[redacted]d 10:10 / 00:22 7 lb 6.9 oz (3.37 kg) F Vag-Spont EPI  LIV     Birth Comments: none   Physical Assessment:   Vitals:   04/11/23 1344  BP: 124/77  Pulse: 84  Weight: 222 lb (100.7 kg)  Height: 5\' 6"  (1.676 m)  Body mass index is 35.83 kg/m.  Objective:  Blood pressure 124/77, pulse 84, height 5\' 6"  (1.676 m), weight 222 lb (100.7 kg), last menstrual period 03/18/2023, not currently breastfeeding.  General:  alert, cooperative, and no distress   Breasts:  negative  Lungs: Normal respiratory effort  Heart:  regular rate and rhythm  Abdomen: soft, non-tender,    Vulva:  normal  Vagina: normal vagina  Cervix:  normal  Corpus: Well involuted  Adnexa:  not  evaluated  Rectal Exam: no hemorrhoids          No results found for this or any previous visit (from the past 24 hour(s)).  Assessment & Plan:  1) Postpartum exam 2) 6 wks s/p spontaneous vaginal delivery 3) bottle feeding 4) Depression screening 5) Contraception counseling  Wants COCs, will call for rx when finds out name of COC she was on before.  Essential components of care per ACOG recommendations:  1.  Mood and well being:  If positive depression screen, discussed and plan developed.  If using tobacco we discussed reduction/cessation and risk of relapse If current substance abuse, we discussed and referral to local resources was offered.   2. Infant care and feeding:  If breastfeeding, discussed returning to work, pumping, breastfeeding-associated pain, guidance regarding return to fertility while lactating if not using another method. If needed, patient was provided with a letter to be allowed to pump q 2-3hrs to support lactation in a private location with access to a refrigerator to store breastmilk.   Recommended that all caregivers be immunized for flu, pertussis and other preventable communicable diseases If pt does not have material needs met for her/baby, referred to local resources for help obtaining these.  3. Sexuality, contraception and birth spacing Provided guidance regarding sexuality, management of dyspareunia, and resumption of intercourse Discussed avoiding interpregnancy interval <74mths and recommended birth spacing of 18 months  4. Sleep and fatigue Discussed coping options for fatigue and sleep disruption Encouraged family/partner/community support of 4 hrs of uninterrupted sleep to help with mood and fatigue  5. Physical recovery  If pt had a C/S, assessed incisional pain and providing guidance on normal vs prolonged recovery If pt had a laceration, perineal healing and pain reviewed.  If urinary or fecal incontinence, discussed management and referred  to PT or uro/gyn if indicated  Patient is safe to resume physical activity. Discussed attainment of healthy weight.  6.  Chronic disease management Discussed pregnancy complications if any, and their implications for future childbearing and long-term maternal health. Review recommendations for prevention of recurrent pregnancy complications, such as aspirin to reduce risk of preeclampsia: yes Pt had GDM: No. If yes, 2hr GTT scheduled: not applicable. Reviewed medications and non-pregnant dosing including consideration of whether pt is breastfeeding using a reliable resource such as LactMed: not applicable Referred for f/u w/ PCP or subspecialist providers as indicated: not applicable  7. Health maintenance Mammogram at 22yo or earlier if indicated Pap smears as indicated  Meds: No orders of the defined types were placed in this encounter.   Follow-up: No follow-ups on file.   No orders of the defined types were placed in this encounter.  Jacklyn Shell DNP, CNM Center for [employer], [facility] 04/11/2023 2:10 PM

## 2023-06-11 ENCOUNTER — Other Ambulatory Visit (HOSPITAL_COMMUNITY): Payer: Self-pay

## 2023-06-12 ENCOUNTER — Other Ambulatory Visit (HOSPITAL_COMMUNITY): Payer: Self-pay

## 2023-07-08 DIAGNOSIS — Z20828 Contact with and (suspected) exposure to other viral communicable diseases: Secondary | ICD-10-CM | POA: Diagnosis not present

## 2023-07-08 DIAGNOSIS — U071 COVID-19: Secondary | ICD-10-CM | POA: Diagnosis not present

## 2023-07-25 DIAGNOSIS — E6609 Other obesity due to excess calories: Secondary | ICD-10-CM | POA: Diagnosis not present

## 2023-07-25 DIAGNOSIS — Z6832 Body mass index (BMI) 32.0-32.9, adult: Secondary | ICD-10-CM | POA: Diagnosis not present

## 2023-07-25 DIAGNOSIS — M436 Torticollis: Secondary | ICD-10-CM | POA: Diagnosis not present

## 2023-08-06 DIAGNOSIS — M436 Torticollis: Secondary | ICD-10-CM | POA: Diagnosis not present

## 2023-08-16 DIAGNOSIS — L089 Local infection of the skin and subcutaneous tissue, unspecified: Secondary | ICD-10-CM | POA: Diagnosis not present

## 2023-08-16 DIAGNOSIS — Z6832 Body mass index (BMI) 32.0-32.9, adult: Secondary | ICD-10-CM | POA: Diagnosis not present

## 2023-08-16 DIAGNOSIS — E6609 Other obesity due to excess calories: Secondary | ICD-10-CM | POA: Diagnosis not present

## 2024-03-20 ENCOUNTER — Encounter (HOSPITAL_COMMUNITY): Payer: Self-pay

## 2024-03-20 ENCOUNTER — Other Ambulatory Visit: Payer: Self-pay

## 2024-03-20 ENCOUNTER — Emergency Department (HOSPITAL_COMMUNITY)
Admission: EM | Admit: 2024-03-20 | Discharge: 2024-03-20 | Disposition: A | Discharge status: Home / Self Care | Attending: Emergency Medicine | Admitting: Emergency Medicine

## 2024-03-20 DIAGNOSIS — M5431 Sciatica, right side: Secondary | ICD-10-CM

## 2024-03-20 DIAGNOSIS — M5441 Lumbago with sciatica, right side: Secondary | ICD-10-CM | POA: Diagnosis not present

## 2024-03-20 DIAGNOSIS — M545 Low back pain, unspecified: Secondary | ICD-10-CM | POA: Diagnosis present

## 2024-03-20 LAB — URINALYSIS, ROUTINE W REFLEX MICROSCOPIC
Bacteria, UA: NONE SEEN
Bilirubin Urine: NEGATIVE
Glucose, UA: NEGATIVE mg/dL
Hgb urine dipstick: NEGATIVE
Ketones, ur: NEGATIVE mg/dL
Nitrite: NEGATIVE
Protein, ur: 100 mg/dL — AB
Specific Gravity, Urine: 1.032 — ABNORMAL HIGH (ref 1.005–1.030)
pH: 6 (ref 5.0–8.0)

## 2024-03-20 LAB — POC URINE PREG, ED: Preg Test, Ur: NEGATIVE

## 2024-03-20 MED ORDER — HYDROCODONE-ACETAMINOPHEN 5-325 MG PO TABS
1.0000 | ORAL_TABLET | Freq: Once | ORAL | Status: AC
  Administered 2024-03-20: 1 via ORAL
  Filled 2024-03-20: qty 1

## 2024-03-20 MED ORDER — HYDROCODONE-ACETAMINOPHEN 5-325 MG PO TABS: ORAL_TABLET | ORAL | 0 refills | Status: AC

## 2024-03-20 MED ORDER — KETOROLAC TROMETHAMINE 30 MG/ML IJ SOLN
15.0000 mg | Freq: Once | INTRAMUSCULAR | Status: AC
  Administered 2024-03-20: 15 mg via INTRAVENOUS
  Filled 2024-03-20: qty 1

## 2024-03-20 MED ORDER — METHOCARBAMOL 500 MG PO TABS
500.0000 mg | ORAL_TABLET | Freq: Once | ORAL | Status: AC
  Administered 2024-03-20: 500 mg via ORAL
  Filled 2024-03-20: qty 1

## 2024-03-20 MED ORDER — METHOCARBAMOL 500 MG PO TABS: 500.0000 mg | ORAL_TABLET | Freq: Three times a day (TID) | ORAL | 0 refills | Status: AC

## 2024-03-20 NOTE — ED Triage Notes (Signed)
 Pt reports she has had lower back pain for a few years and it got worse after her epidural last year.  Pt reports today she lifted a folding chair and now the pain is radiating down her left hip.  EMS gave 15 mg Toradol .

## 2024-03-20 NOTE — ED Provider Notes (Signed)
 [place] EMERGENCY DEPARTMENT AT [facility] Provider Note   CSN: [id number] Arrival date & time: 03/20/24  1330     History {Add pertinent medical, surgical, social history, OB history to HPI:1} Chief Complaint  Patient presents with   Back Pain    Christina Schultz is a 23 y.o. female.   Back Pain      Home Medications Prior to Admission medications   Medication Sig Start Date End Date Taking? Authorizing Provider  acetaminophen  (TYLENOL ) 325 MG tablet Take 2 tablets (650 mg total) by mouth every 4 (four) hours as needed (for pain scale < 4). Patient not taking: Reported on 04/11/2023 03/04/23   Autry-Lott, Jeneane Miracle, DO  ibuprofen  (ADVIL ) 600 MG tablet Take 1 tablet (600 mg total) by mouth every 6 (six) hours. Patient not taking: Reported on 04/11/2023 03/04/23   Autry-Lott, Jeneane Miracle, DO  NIFEdipine  (ADALAT  CC) 30 MG 24 hr tablet Take 1 tablet (30 mg total) by mouth daily. Patient not taking: Reported on 03/08/2023 03/04/23 04/03/23  Autry-Lott, Jeneane Miracle, DO  Prenatal Vit-Fe Fumarate-FA (PRENATAL VITAMIN PO) Take by mouth. Patient not taking: Reported on 04/11/2023    [provider]      Allergies    Patient has no known allergies.    Review of Systems   Review of Systems  Musculoskeletal:  Positive for back pain.    Physical Exam Updated Vital Signs BP 116/75   Pulse 65   Temp 98.8 F (37.1 C) (Oral)   Resp 16   SpO2 98%  Physical Exam  ED Results / Procedures / Treatments   Labs (all labs ordered are listed, but only abnormal results are displayed) Labs Reviewed  URINALYSIS, ROUTINE W REFLEX MICROSCOPIC - Abnormal; Notable for the following components:      Result Value   Color, Urine AMBER (*)    APPearance HAZY (*)    Specific Gravity, Urine 1.032 (*)    Protein, ur 100 (*)    Leukocytes,Ua TRACE (*)    All other components within normal limits  POC URINE PREG, ED    EKG None  Radiology No results found.  Procedures Procedures   {Document cardiac monitor, telemetry assessment procedure when appropriate:1}  Medications Ordered in ED Medications  ketorolac  (TORADOL ) 30 MG/ML injection 15 mg (has no administration in time range)  methocarbamol  (ROBAXIN ) tablet 500 mg (500 mg Oral Given 03/20/24 1423)  HYDROcodone -acetaminophen  (NORCO/VICODIN) 5-325 MG per tablet 1 tablet (1 tablet Oral Given 03/20/24 1423)    ED Course/ Medical Decision Making/ A&P   {   Click here for ABCD2, HEART and other calculatorsREFRESH Note before signing :1}                              Medical Decision Making Amount and/or Complexity of Data Reviewed Labs: ordered. Discussion of management or test interpretation with external provider(s): Pt ambulatory here.  Likely sciatica.  No evidence of UTI, not pregnant.  She has ibuprofen  at home.  Will treat symptomatically with short course of pain medication and database reviewed.  Muscle relaxer as well.  She will follow-up closely outpatient with PCP if needed.  Risk Prescription drug management.     {Document critical care time when appropriate:1} {Document review of labs and clinical decision tools ie heart score, Chads2Vasc2 etc:1}  {Document your independent review of radiology images, and any outside records:1} {Document your discussion with family members, caretakers, and with consultants:1} {Document social  determinants of health affecting pt's care:1} {Document your decision making why or why not admission, treatments were needed:1} Final Clinical Impression(s) / ED Diagnoses Final diagnoses:  None    Rx / DC Orders ED Discharge Orders     None

## 2024-03-20 NOTE — Discharge Instructions (Signed)
 Use over-the-counter 4% lidocaine  patches applied to the affected area as directed.  Avoid twisting bending or heavy lifting for at least 1 week.  Recommend that you take over-the-counter ibuprofen  600 to 800 mg 3 times a day with food for 1 week.  Follow-up with your primary care provider for recheck next week.  Return to the emergency department if you develop any new or worsening symptoms
